# Patient Record
Sex: Male | Born: 1955 | Race: White | Hispanic: No | Marital: Married | Smoking: Never smoker
Health system: Southern US, Community
[De-identification: ages and names within clinical notes are randomized; demographics above are authoritative.]

## PROBLEM LIST (undated history)

## (undated) DIAGNOSIS — G629 Polyneuropathy, unspecified: Secondary | ICD-10-CM

## (undated) DIAGNOSIS — K5790 Diverticulosis of intestine, part unspecified, without perforation or abscess without bleeding: Secondary | ICD-10-CM

## (undated) DIAGNOSIS — E119 Type 2 diabetes mellitus without complications: Secondary | ICD-10-CM

## (undated) DIAGNOSIS — R350 Frequency of micturition: Secondary | ICD-10-CM

## (undated) DIAGNOSIS — R3915 Urgency of urination: Secondary | ICD-10-CM

## (undated) DIAGNOSIS — Z87442 Personal history of urinary calculi: Secondary | ICD-10-CM

## (undated) DIAGNOSIS — G709 Myoneural disorder, unspecified: Secondary | ICD-10-CM

## (undated) DIAGNOSIS — Z860100 Personal history of colon polyps, unspecified: Secondary | ICD-10-CM

## (undated) DIAGNOSIS — N400 Enlarged prostate without lower urinary tract symptoms: Secondary | ICD-10-CM

## (undated) DIAGNOSIS — R339 Retention of urine, unspecified: Secondary | ICD-10-CM

## (undated) DIAGNOSIS — I10 Essential (primary) hypertension: Secondary | ICD-10-CM

## (undated) HISTORY — PX: NO PAST SURGERIES: SHX2092

## (undated) HISTORY — PX: COLONOSCOPY: SHX174

---

## 2004-03-11 ENCOUNTER — Encounter: Admission: RE | Admit: 2004-03-11 | Discharge: 2004-03-11 | Payer: Self-pay | Admitting: Gastroenterology

## 2004-03-12 ENCOUNTER — Emergency Department (HOSPITAL_COMMUNITY): Admission: EM | Admit: 2004-03-12 | Discharge: 2004-03-13 | Payer: Self-pay | Admitting: Emergency Medicine

## 2007-09-03 ENCOUNTER — Encounter: Admission: RE | Admit: 2007-09-03 | Discharge: 2007-09-03 | Payer: Self-pay | Admitting: Rheumatology

## 2009-10-01 ENCOUNTER — Encounter: Admission: RE | Admit: 2009-10-01 | Discharge: 2009-10-01 | Payer: Self-pay | Admitting: Podiatry

## 2010-01-25 ENCOUNTER — Encounter: Payer: Self-pay | Admitting: Rheumatology

## 2011-04-01 ENCOUNTER — Emergency Department (HOSPITAL_COMMUNITY): Payer: Federal, State, Local not specified - PPO

## 2011-04-01 ENCOUNTER — Encounter (HOSPITAL_COMMUNITY): Payer: Self-pay | Admitting: *Deleted

## 2011-04-01 ENCOUNTER — Emergency Department (HOSPITAL_COMMUNITY)
Admission: EM | Admit: 2011-04-01 | Discharge: 2011-04-01 | Disposition: A | Discharge status: Home / Self Care | Payer: Federal, State, Local not specified - PPO | Attending: Emergency Medicine | Admitting: Emergency Medicine

## 2011-04-01 DIAGNOSIS — W1809XA Striking against other object with subsequent fall, initial encounter: Secondary | ICD-10-CM | POA: Insufficient documentation

## 2011-04-01 DIAGNOSIS — S81809A Unspecified open wound, unspecified lower leg, initial encounter: Secondary | ICD-10-CM

## 2011-04-01 DIAGNOSIS — S81009A Unspecified open wound, unspecified knee, initial encounter: Secondary | ICD-10-CM | POA: Insufficient documentation

## 2011-04-01 HISTORY — DX: Essential (primary) hypertension: I10

## 2011-04-01 HISTORY — DX: Polyneuropathy, unspecified: G62.9

## 2011-04-01 LAB — POCT I-STAT, CHEM 8
BUN: 16 mg/dL (ref 6–23)
Calcium, Ion: 1.24 mmol/L (ref 1.12–1.32)
Chloride: 108 mEq/L (ref 96–112)
Glucose, Bld: 108 mg/dL — ABNORMAL HIGH (ref 70–99)
HCT: 44 % (ref 39.0–52.0)
Potassium: 3.7 mEq/L (ref 3.5–5.1)
Sodium: 144 mEq/L (ref 135–145)

## 2011-04-01 LAB — DIFFERENTIAL
Basophils Relative: 0 % (ref 0–1)
Eosinophils Absolute: 0.1 10*3/uL (ref 0.0–0.7)
Lymphocytes Relative: 27 % (ref 12–46)
Monocytes Absolute: 0.6 10*3/uL (ref 0.1–1.0)
Neutro Abs: 4.5 10*3/uL (ref 1.7–7.7)
Neutrophils Relative %: 62 % (ref 43–77)

## 2011-04-01 LAB — CBC
MCHC: 34.5 g/dL (ref 30.0–36.0)
RBC: 5.13 MIL/uL (ref 4.22–5.81)
RDW: 13.2 % (ref 11.5–15.5)

## 2011-04-01 MED ORDER — DOXYCYCLINE HYCLATE 100 MG PO CAPS: 100.0000 mg | ORAL_CAPSULE | Freq: Two times a day (BID) | ORAL | Status: AC

## 2011-04-01 NOTE — ED Provider Notes (Signed)
Complains of wound and nonpainful shin which occurred as a result of falling against breakfast in January2013. Wound is nonpainful initially with scabbed lesion. Scab fell off last night and patient has had yellowish drainage from the wound since last night. On exam patient is alert nontoxic left lower extremity shin reveals a dime size lesion which is oozing yellowish clear material nontender no surrounding redness no fluctuance DP pulse 2+. I suspect given the location of the wound that he may have osteomyelitis  Doug Sou, MD 04/01/11 1858

## 2011-04-01 NOTE — ED Notes (Signed)
Left leg infection.  Pt injured this leg in January.  Wound is red and draining.  No fever or chills

## 2011-04-01 NOTE — ED Provider Notes (Signed)
History     CSN: [id number]  Arrival date & time 04/01/11  1612   First MD Initiated Contact with Patient 04/01/11 1720      Chief Complaint  Patient presents with  . Wound Infection    (Consider location/radiation/quality/duration/timing/severity/associated sxs/prior treatment) Patient is a 56 y.o. male presenting with leg pain. The history is provided by the patient and the spouse.  Leg Pain  Incident onset: 2 months ago. The incident occurred at home. The injury mechanism was a fall. The pain is present in the left leg. The pain is mild. The pain has been constant since onset. Pertinent negatives include no numbness, no inability to bear weight, no loss of motion, no loss of sensation and no tingling. He reports no foreign bodies present. The symptoms are aggravated by nothing. He has tried nothing for the symptoms. The treatment provided no relief.    Past Medical History  Diagnosis Date  . Neuropathy   . Hypertension   . Diabetes mellitus     History reviewed. No pertinent past surgical history.  No family history on file.  History  Substance Use Topics  . Smoking status: Not on file  . Smokeless tobacco: Not on file  . Alcohol Use: No      Review of Systems  Constitutional: Negative for fever and chills.  HENT: Negative for congestion and rhinorrhea.   Respiratory: Negative for cough and shortness of breath.   Cardiovascular: Negative for chest pain and leg swelling.  Gastrointestinal: Negative for nausea, vomiting, abdominal pain, constipation and blood in stool.  Genitourinary: Negative for dysuria and decreased urine volume.  Skin: Positive for wound.  Neurological: Negative for tingling, numbness and headaches.  Psychiatric/Behavioral: Negative for confusion.  All other systems reviewed and are negative.    Allergies  Review of patient's allergies indicates no known allergies.  Home Medications   Current Outpatient Rx  Name Route Sig Dispense  Refill  . METOPROLOL SUCCINATE ER 50 MG PO TB24 Oral Take 50 mg by mouth daily. Take with or immediately following a meal.      BP 157/88  Pulse 85  Temp(Src) 98.3 F (36.8 C) (Oral)  Resp 14  SpO2 96%  Physical Exam  Nursing note and vitals reviewed. Constitutional: He is oriented to person, place, and time. He appears well-developed and well-nourished.  HENT:  Head: Normocephalic and atraumatic.  Right Ear: External ear normal.  Left Ear: External ear normal.  Nose: Nose normal.  Neck: Neck supple.  Cardiovascular: Normal rate, regular rhythm, normal heart sounds and intact distal pulses.   Pulmonary/Chest: Effort normal and breath sounds normal. No respiratory distress. He has no wheezes. He has no rales.  Abdominal: Soft. He exhibits no distension and no mass. There is no tenderness. There is no rebound and no guarding.  Musculoskeletal: He exhibits no edema.       Legs: Lymphadenopathy:    He has no cervical adenopathy.  Neurological: He is alert and oriented to person, place, and time.  Skin: Skin is warm and dry.    ED Course  Procedures (including critical care time)  Labs Reviewed  CBC - Abnormal; Notable for the following:    Platelets 127 (*)    All other components within normal limits  POCT I-STAT, CHEM 8 - Abnormal; Notable for the following:    Glucose, Bld 108 (*)    All other components within normal limits  DIFFERENTIAL   Dg Tibia/fibula Left  04/01/2011  *RADIOLOGY REPORT*  Clinical Data: Healing wound anterior calf.  LEFT TIBIA AND FIBULA - 2 VIEW  Comparison: Left ankle 08/19/2009  Findings: No acute bony abnormality.  Specifically, no fracture, subluxation, or dislocation.  Soft tissues are intact.  Mild degenerative changes in the ankle.  IMPRESSION: No acute bony abnormality.  Original Report Authenticated By: Cyndie Chime, M.D.     1. Open wound, lower leg       MDM  56 yo male with left leg wound for a few months. Initially injured leg  in a fall on a brick wall, has now had poorly healing leg wound (about 1 dime size). Does not appear infected. Mild yellow-clear discharge since last night. Concern for osteo, but xray negative. Discussed with Dr Lequita Halt of Orthopedics on call, will put on doxy to help with MRSA coverage and get close follow up with orthopedics. Patient's VSS, no evidence of systemic infection. Will d/c home with precautions and wound care instructions.        Pricilla Loveless, MD 04/01/11 [phone]

## 2011-04-02 NOTE — ED Provider Notes (Signed)
I have personally seen and examined the patient.  I have discussed the plan of care with the resident.  I have reviewed the documentation on PMH/FH/Soc. History.  I have reviewed the documentation of the resident and agree.  Melville Engen, MD 04/02/11 0129 

## 2014-05-20 ENCOUNTER — Other Ambulatory Visit: Payer: Self-pay | Admitting: Urology

## 2014-05-27 ENCOUNTER — Other Ambulatory Visit: Payer: Self-pay | Admitting: Urology

## 2014-06-05 ENCOUNTER — Encounter (HOSPITAL_BASED_OUTPATIENT_CLINIC_OR_DEPARTMENT_OTHER): Payer: Self-pay | Admitting: *Deleted

## 2014-06-06 ENCOUNTER — Encounter (HOSPITAL_BASED_OUTPATIENT_CLINIC_OR_DEPARTMENT_OTHER): Payer: Self-pay | Admitting: *Deleted

## 2014-06-06 NOTE — Progress Notes (Addendum)
NPO AFTER MN.  ARRIVE AT 0715.  NEEDS ISTAT AND EKG AND UA (SPOKE W/ PAM, OR SCHEDULER, STATES PER DR ESKRIDGE OK TO DO UA DOS (ALREADY HAD UA W/ CULTURE DONE IN OFFICE).  WILL TAKE FLOMAX  AND MACRODANTIN AM DOS W/ SIPS OF WATER.

## 2014-06-12 NOTE — H&P (Signed)
History of Present Illness F/u -     1-BPH with lower urinary tract symptoms-several yr h/o weak stream, urinary urgency, intermittent flow. He was recently seen in the urgent care and was told he had an enlarged prostate on exam. He was treated with Cipro for prostatitis. No other tx or prostate surgery.  -June 2015 normal DRE, BPH on exam  -May 2016 PSA 2.39    2- nephrolithiasis-h/o a 7 mm left ureteral stone in 2006. He is not certain he ever passed it. H/o some dull low back pain which may occasionally be more on the left side.      May 2016 interval history  Patient returned to continue management of BPH with lower urinary tract symptoms and nephrolithiasis. A KUB today revealed no stones. Renal ultrasound showed multiple cysts in both kidneys with some more complex cyst in the left kidney. It was initially 390 mL, repeat postvoid 294 mL.    He continues to void with hesitancy and weak stream. He really notices a decrease in his stream if he misses a dose of tamsulosin.    ua clear.     PSA is normal at 2.39.     Past Medical History Problems  1. History of diabetes mellitus (Z86.39) 2. History of hypertension (Z86.79) 3. History of renal calculi (Z61.096)  Surgical History Problems  1. History of No Surgical Problems  Current Meds 1. Tamsulosin HCl - 0.4 MG Oral Capsule; TAKE 1 CAPSULE Daily;  Therapy: 29Jul2015 to (Evaluate:23Jul2016)  Requested for: 29Jul2015; Last  Rx:29Jul2015 Ordered 2. Valsartan-Hydrochlorothiazide TABS;  Therapy: (Recorded:29Jul2015) to Recorded  Allergies Medication  1. Tamsulosin HCl CAPS  Family History Problems  1. Family history of hypertension (Z82.49) : Father 2. Family history of malignant neoplasm (Z80.9) : Father  Social History Problems  1. Married 2. Never a smoker 3. No alcohol use 4. No caffeine use 5. One child  Vitals Vital Signs [Data Includes: Last 1 Day]  Recorded: 12May2016 12:03PM  Blood  Pressure: 117 / 69 Temperature: 98.2 F Heart Rate: 85  Physical Exam Constitutional: Well nourished and well developed . No acute distress.  Pulmonary: No respiratory distress and normal respiratory rhythm and effort.  Cardiovascular: Heart rate and rhythm are normal . No peripheral edema.  Neuro/Psych:. Mood and affect are appropriate.    Results/Data Urine [Data Includes: Last 1 Day]   12May2016  COLOR YELLOW   APPEARANCE CLEAR   SPECIFIC GRAVITY 1.020   pH 5.5   GLUCOSE 250 mg/dL  BILIRUBIN NEG   KETONE NEG mg/dL  BLOOD NEG   PROTEIN NEG mg/dL  UROBILINOGEN 0.2 mg/dL  NITRITE NEG   LEUKOCYTE ESTERASE NEG    Procedure KUB today-comparison to prior CT, findings: The kidneys appeared normal. There were no stones or calcifications over the either kidney or the ureters. Bladder area appeared normal. There was a small stable right phlebolith. The bowels appeared normal. Liver and spleen shadows appear normal. The bowel gas pattern appear normal.   Assessment Assessed  1. Complex renal cyst (N28.1) 2. BPH (benign prostatic hypertrophy) with urinary obstruction (N40.1,N13.8) 3. Incomplete bladder emptying (R33.9)  Plan  Complex renal cyst  1. BUN & CREATININE; Status:In Progress - Specimen/Data Collected;   Done: 12May2016 2. VENIPUNCTURE; Status:Complete;   Done: 12May2016 Health Maintenance  3. UA With REFLEX; [Do Not Release]; Status:Complete;   Done: 12May2016 10:55AM Incomplete bladder emptying  4. Follow-up Schedule Surgery Office  Follow-up  Status: Hold For - Appointment   Requested for: 12May2016  Urine sent for culture as a precaution, CT of the abdomen and pelvis to assess prostate size as well as screening complex renal cyst, follow-up for greenlight photo vaporization of the prostate   Discussion/Summary     BPH associated with weak stream and incomplete bladder emptying-we discussed the nature risk and benefits of adding a 5 alpha reductase ACE inhibitor to  the tamsulosin. Also discussed prostate procedures. Patient said his dad had to undergo a TURP and his dad said he voided much better when he finally had the surgery done compared to when he was taking medication. The patient would like to limit medications which is certainly reasonable. We discussed the nature risks benefits and alternatives to greenlight photo vaporization of the prostate. Patient reviewed the surgical video. We discussed risks such as bleeding, infection, stricture, incontinence, erectile dysfunction and retrograde ejaculation among others. We discussed in most cases flow symptoms and incomplete bladder emptying improve but there are some cases where symptoms of weak flow and incomplete bladder emptying continue which my be more related to the bladder.     inc bladder emptying - we discussed causes of incomplete bladder emptying stitches bladder dysfunction versus bladder outlet obstruction. He is voiding now and therefore I don't think any of the catheter CIC. As we talked about adding other medications or procedures.     nephrolithiasis, back pain, complex renal cysts on U/S - will follow-up with CT.      Signatures Electronically signed by : Jerilee Field, M.D.; May 15 2014 12:41PM EST   Add: urine culture grew staph and I started him on nitrofurantoin daily. May 2016 CT abdomen and pelvis-stable bilateral renal cysts looking back to 2006; prostate about 56 g but a prominent median lobe and distended bladder.

## 2014-06-13 ENCOUNTER — Encounter (HOSPITAL_BASED_OUTPATIENT_CLINIC_OR_DEPARTMENT_OTHER)
Admission: RE | Disposition: A | Discharge status: Home / Self Care | Payer: Self-pay | Source: Ambulatory Visit | Attending: Urology

## 2014-06-13 ENCOUNTER — Ambulatory Visit (HOSPITAL_BASED_OUTPATIENT_CLINIC_OR_DEPARTMENT_OTHER): Payer: Federal, State, Local not specified - PPO | Admitting: Anesthesiology

## 2014-06-13 ENCOUNTER — Encounter (HOSPITAL_BASED_OUTPATIENT_CLINIC_OR_DEPARTMENT_OTHER): Payer: Self-pay | Admitting: *Deleted

## 2014-06-13 ENCOUNTER — Ambulatory Visit (HOSPITAL_BASED_OUTPATIENT_CLINIC_OR_DEPARTMENT_OTHER)
Admission: RE | Admit: 2014-06-13 | Discharge: 2014-06-13 | Disposition: A | Discharge status: Home / Self Care | Payer: Federal, State, Local not specified - PPO | Source: Ambulatory Visit | Attending: Urology | Admitting: Urology

## 2014-06-13 DIAGNOSIS — R3912 Poor urinary stream: Secondary | ICD-10-CM | POA: Insufficient documentation

## 2014-06-13 DIAGNOSIS — N359 Urethral stricture, unspecified: Secondary | ICD-10-CM | POA: Diagnosis not present

## 2014-06-13 DIAGNOSIS — Z79899 Other long term (current) drug therapy: Secondary | ICD-10-CM | POA: Insufficient documentation

## 2014-06-13 DIAGNOSIS — E119 Type 2 diabetes mellitus without complications: Secondary | ICD-10-CM | POA: Diagnosis not present

## 2014-06-13 DIAGNOSIS — I10 Essential (primary) hypertension: Secondary | ICD-10-CM | POA: Insufficient documentation

## 2014-06-13 DIAGNOSIS — N390 Urinary tract infection, site not specified: Secondary | ICD-10-CM | POA: Diagnosis not present

## 2014-06-13 DIAGNOSIS — Z87442 Personal history of urinary calculi: Secondary | ICD-10-CM | POA: Insufficient documentation

## 2014-06-13 DIAGNOSIS — N138 Other obstructive and reflux uropathy: Secondary | ICD-10-CM | POA: Insufficient documentation

## 2014-06-13 DIAGNOSIS — N281 Cyst of kidney, acquired: Secondary | ICD-10-CM | POA: Insufficient documentation

## 2014-06-13 DIAGNOSIS — R3914 Feeling of incomplete bladder emptying: Secondary | ICD-10-CM | POA: Insufficient documentation

## 2014-06-13 DIAGNOSIS — N401 Enlarged prostate with lower urinary tract symptoms: Secondary | ICD-10-CM | POA: Insufficient documentation

## 2014-06-13 HISTORY — PX: GREEN LIGHT LASER TURP (TRANSURETHRAL RESECTION OF PROSTATE: SHX6260

## 2014-06-13 HISTORY — DX: Benign prostatic hyperplasia without lower urinary tract symptoms: N40.0

## 2014-06-13 HISTORY — DX: Urgency of urination: R39.15

## 2014-06-13 HISTORY — DX: Frequency of micturition: R35.0

## 2014-06-13 HISTORY — DX: Retention of urine, unspecified: R33.9

## 2014-06-13 HISTORY — DX: Type 2 diabetes mellitus without complications: E11.9

## 2014-06-13 HISTORY — DX: Personal history of urinary calculi: Z87.442

## 2014-06-13 LAB — URINALYSIS, ROUTINE W REFLEX MICROSCOPIC
BILIRUBIN URINE: NEGATIVE
Glucose, UA: 250 mg/dL — AB
Hgb urine dipstick: NEGATIVE
KETONES UR: NEGATIVE mg/dL
Leukocytes, UA: NEGATIVE
Nitrite: NEGATIVE
PROTEIN: NEGATIVE mg/dL
Specific Gravity, Urine: 1.024 (ref 1.005–1.030)
UROBILINOGEN UA: 0.2 mg/dL (ref 0.0–1.0)
pH: 5.5 (ref 5.0–8.0)

## 2014-06-13 LAB — POCT I-STAT 4, (NA,K, GLUC, HGB,HCT)
Glucose, Bld: 175 mg/dL — ABNORMAL HIGH (ref 65–99)
HEMATOCRIT: 41 % (ref 39.0–52.0)
HEMOGLOBIN: 13.9 g/dL (ref 13.0–17.0)
Potassium: 4 mmol/L (ref 3.5–5.1)
Sodium: 140 mmol/L (ref 135–145)

## 2014-06-13 LAB — GLUCOSE, CAPILLARY: Glucose-Capillary: 147 mg/dL — ABNORMAL HIGH (ref 65–99)

## 2014-06-13 SURGERY — GREEN LIGHT LASER TURP (TRANSURETHRAL RESECTION OF PROSTATE
Anesthesia: General | Site: Prostate

## 2014-06-13 MED ORDER — FENTANYL CITRATE (PF) 100 MCG/2ML IJ SOLN
INTRAMUSCULAR | Status: AC
  Filled 2014-06-13: qty 4

## 2014-06-13 MED ORDER — CEFAZOLIN SODIUM-DEXTROSE 2-3 GM-% IV SOLR
INTRAVENOUS | Status: AC
  Filled 2014-06-13: qty 50

## 2014-06-13 MED ORDER — CEFAZOLIN SODIUM-DEXTROSE 2-3 GM-% IV SOLR
2.0000 g | INTRAVENOUS | Status: AC
  Administered 2014-06-13: 2 g via INTRAVENOUS
  Filled 2014-06-13: qty 50

## 2014-06-13 MED ORDER — LACTATED RINGERS IV SOLN
INTRAVENOUS | Status: DC
  Administered 2014-06-13: 08:00:00 via INTRAVENOUS
  Filled 2014-06-13: qty 1000

## 2014-06-13 MED ORDER — FENTANYL CITRATE (PF) 100 MCG/2ML IJ SOLN
INTRAMUSCULAR | Status: DC | PRN
  Administered 2014-06-13: 50 ug via INTRAVENOUS
  Administered 2014-06-13: 25 ug via INTRAVENOUS

## 2014-06-13 MED ORDER — PHENYLEPHRINE HCL 10 MG/ML IJ SOLN
INTRAMUSCULAR | Status: DC | PRN
  Administered 2014-06-13 (×3): 80 ug via INTRAVENOUS

## 2014-06-13 MED ORDER — MIDAZOLAM HCL 5 MG/5ML IJ SOLN
INTRAMUSCULAR | Status: DC | PRN
  Administered 2014-06-13: 2 mg via INTRAVENOUS

## 2014-06-13 MED ORDER — MIDAZOLAM HCL 2 MG/2ML IJ SOLN
INTRAMUSCULAR | Status: AC
  Filled 2014-06-13: qty 2

## 2014-06-13 MED ORDER — DEXAMETHASONE SODIUM PHOSPHATE 4 MG/ML IJ SOLN
INTRAMUSCULAR | Status: DC | PRN
  Administered 2014-06-13: 10 mg via INTRAVENOUS

## 2014-06-13 MED ORDER — LIDOCAINE HCL (CARDIAC) 20 MG/ML IV SOLN
INTRAVENOUS | Status: DC | PRN
  Administered 2014-06-13: 100 mg via INTRAVENOUS

## 2014-06-13 MED ORDER — OXYCODONE-ACETAMINOPHEN 5-325 MG PO TABS: 1.0000 | ORAL_TABLET | Freq: Four times a day (QID) | ORAL | Status: DC | PRN

## 2014-06-13 MED ORDER — EPHEDRINE SULFATE 50 MG/ML IJ SOLN
INTRAMUSCULAR | Status: DC | PRN
  Administered 2014-06-13: 10 mg via INTRAVENOUS

## 2014-06-13 MED ORDER — ONDANSETRON HCL 4 MG/2ML IJ SOLN
INTRAMUSCULAR | Status: DC | PRN
  Administered 2014-06-13: 4 mg via INTRAVENOUS

## 2014-06-13 MED ORDER — CEFAZOLIN SODIUM 1-5 GM-% IV SOLN
1.0000 g | INTRAVENOUS | Status: DC
  Filled 2014-06-13: qty 50

## 2014-06-13 MED ORDER — ONDANSETRON HCL 4 MG/2ML IJ SOLN
4.0000 mg | Freq: Four times a day (QID) | INTRAMUSCULAR | Status: DC | PRN
  Filled 2014-06-13: qty 2

## 2014-06-13 MED ORDER — OXYCODONE HCL 5 MG/5ML PO SOLN
5.0000 mg | Freq: Once | ORAL | Status: AC | PRN
  Filled 2014-06-13: qty 5

## 2014-06-13 MED ORDER — PHENYLEPHRINE HCL 10 MG/ML IJ SOLN
10.0000 mg | INTRAVENOUS | Status: DC | PRN
  Administered 2014-06-13: 40 ug/min via INTRAVENOUS

## 2014-06-13 MED ORDER — SODIUM CHLORIDE 0.9 % IR SOLN
Status: DC | PRN
  Administered 2014-06-13: 16000 mL

## 2014-06-13 MED ORDER — HYDROMORPHONE HCL 1 MG/ML IJ SOLN
0.2500 mg | INTRAMUSCULAR | Status: DC | PRN
  Filled 2014-06-13: qty 1

## 2014-06-13 MED ORDER — OXYCODONE HCL 5 MG PO TABS
5.0000 mg | ORAL_TABLET | Freq: Once | ORAL | Status: AC | PRN
  Administered 2014-06-13: 5 mg via ORAL
  Filled 2014-06-13: qty 1

## 2014-06-13 MED ORDER — OXYCODONE HCL 5 MG PO TABS
ORAL_TABLET | ORAL | Status: AC
  Filled 2014-06-13: qty 1

## 2014-06-13 MED ORDER — ACETAMINOPHEN 10 MG/ML IV SOLN
INTRAVENOUS | Status: DC | PRN
  Administered 2014-06-13: 1000 mg via INTRAVENOUS

## 2014-06-13 MED ORDER — PROPOFOL 10 MG/ML IV BOLUS
INTRAVENOUS | Status: DC | PRN
  Administered 2014-06-13: 200 mg via INTRAVENOUS

## 2014-06-13 SURGICAL SUPPLY — 29 items
BAG URINE DRAINAGE (UROLOGICAL SUPPLIES) ×2 IMPLANT
BAG URO CATCHER STRL LF (DRAPE) ×2 IMPLANT
CATH COUDE FOLEY 2W 5CC 18FR (CATHETERS) IMPLANT
CATH FOLEY 2WAY SLVR  5CC 18FR (CATHETERS)
CATH FOLEY 2WAY SLVR 5CC 18FR (CATHETERS) IMPLANT
CLOTH BEACON ORANGE TIMEOUT ST (SAFETY) ×2 IMPLANT
ELECT BIVAP BIPO 22/24 DONUT (ELECTROSURGICAL)
ELECT LOOP MED HF 24F 12D (CUTTING LOOP) IMPLANT
ELECTRD BIVAP BIPO 22/24 DONUT (ELECTROSURGICAL) IMPLANT
FEE RENTAL LASER GREENLIGHT (Laser) ×1 IMPLANT
GLOVE BIO SURGEON STRL SZ 6.5 (GLOVE) ×1 IMPLANT
GLOVE BIO SURGEON STRL SZ7.5 (GLOVE) ×2 IMPLANT
GLOVE BIOGEL PI IND STRL 6.5 (GLOVE) IMPLANT
GLOVE BIOGEL PI INDICATOR 6.5 (GLOVE) ×3
GOWN STRL REUS W/ TWL XL LVL3 (GOWN DISPOSABLE) ×1 IMPLANT
GOWN STRL REUS W/TWL LRG LVL3 (GOWN DISPOSABLE) ×1 IMPLANT
GOWN STRL REUS W/TWL XL LVL3 (GOWN DISPOSABLE) ×2
HOLDER FOLEY CATH W/STRAP (MISCELLANEOUS) ×1 IMPLANT
IV NS 1000ML (IV SOLUTION) ×2
IV NS 1000ML BAXH (IV SOLUTION) ×1 IMPLANT
IV NS IRRIG 3000ML ARTHROMATIC (IV SOLUTION) ×6 IMPLANT
LASER FIBER /GREENLIGHT LASER (Laser) ×2 IMPLANT
LASER GREENLIGHT RENTAL P/PROC (Laser) ×2 IMPLANT
LOOP CUT BIPOLAR 24F LRG (ELECTROSURGICAL) IMPLANT
PACK CYSTO (CUSTOM PROCEDURE TRAY) ×2 IMPLANT
SYR 30ML LL (SYRINGE) IMPLANT
SYRINGE IRR TOOMEY STRL 70CC (SYRINGE) IMPLANT
TUBE CONNECTING 12X1/4 (SUCTIONS) ×1 IMPLANT
TUBING SUCTION BULK 100 FT (MISCELLANEOUS) IMPLANT

## 2014-06-13 NOTE — Discharge Instructions (Signed)
Prostate Laser Surgery, Care After Refer to this sheet in the next few weeks. These instructions provide you with information on caring for yourself after your procedure. Your health care provider may also give you more specific instructions. Your treatment has been planned according to current medical practices, but problems sometimes occur. Call your health care provider if you have any problems or questions after your procedure. WHAT TO EXPECT AFTER THE PROCEDURE  You may notice blood in your urine that could last up to 3 weeks.  After your catheter is removed, you will have burning (especially at the tip of your penis) when you urinate, especially at the end of urination. For the first few weeks after your procedure, you will feel the need to urinate often. HOME CARE INSTRUCTIONS  1. Do not perform vigorous exercise, especially heavy lifting, for 1 week or as directed by your health care provider. 2. Avoid sexual activity for 4-6 weeks or as directed by your health care provider. 3. Do not ride in a car for extended periods for 1 month or as directed by your health care provider. 4. Do not strain to have a bowel movement. Drink a lot of fluids and and make sure you get enough fiber in your diet. 5. Drink enough fluids to keep your urine clear or pale yellow. SEEK IMMEDIATE MEDICAL CARE IF:  1. Your catheter has been removed and you are suddenly unable to urinate. 2. Your catheter has not been removed, and it develops a blockage. 3. You start to have blood clots in your urine. 4. The blood in your urine becomes persistent or gets thick. 5. Your temperature is greater than 100.71F (38.1C). 6. You develop chest pains. 7. You develop shortness of breath. 8. You develop leg swelling or pain. MAKE SURE YOU: 1. Understand these instructions. 2. Will watch your condition. 3. Will get help right away if you are not doing well or get worse.     Foley Catheter Care A Foley catheter is a  soft, flexible tube that is placed into the bladder to drain urine. A Foley catheter may be inserted if:  You leak urine or are not able to control when you urinate (urinary incontinence).  You are not able to urinate when you need to (urinary retention).  You had prostate surgery or surgery on the genitals.  You have certain medical conditions, such as multiple sclerosis, dementia, or a spinal cord injury. If you are going home with a Foley catheter in place, follow the instructions below. TAKING CARE OF THE CATHETER 6. Wash your hands with soap and water. 7. Using mild soap and warm water on a clean washcloth:  Clean the area on your body closest to the catheter insertion site using a circular motion, moving away from the catheter. Never wipe toward the catheter because this could sweep bacteria up into the urethra and cause infection.  Remove all traces of soap. Pat the area dry with a clean towel. For males, reposition the foreskin. 8. Attach the catheter to your leg so there is no tension on the catheter. Use adhesive tape or a leg strap. If you are using adhesive tape, remove any sticky residue left behind by the previous tape you used. 9. Keep the drainage bag below the level of the bladder, but keep it off the floor. 10. Check throughout the day to be sure the catheter is working and urine is draining freely. Make sure the tubing does not become kinked. 11. Do not  pull on the catheter or try to remove it. Pulling could damage internal tissues. TAKING CARE OF THE DRAINAGE BAGS You will be given two drainage bags to take home. One is a large overnight drainage bag, and the other is a smaller leg bag that fits underneath clothing. You may wear the overnight bag at any time, but you should never wear the smaller leg bag at night. Follow the instructions below for how to empty, change, and clean your drainage bags. Emptying the Drainage Bag You must empty your drainage bag when it is  -  full or at least 2-3 times a day. 9. Wash your hands with soap and water. 10. Keep the drainage bag below your hips, below the level of your bladder. This stops urine from going back into the tubing and into your bladder. 11. Hold the dirty bag over the toilet or a clean container. 12. Open the pour spout at the bottom of the bag and empty the urine into the toilet or container. Do not let the pour spout touch the toilet, container, or any other surface. Doing so can place bacteria on the bag, which can cause an infection. 13. Clean the pour spout with a gauze pad or cotton ball that has rubbing alcohol on it. 14. Close the pour spout. 15. Attach the bag to your leg with adhesive tape or a leg strap. 16. Wash your hands well. Changing the Drainage Bag Change your drainage bag once a month or sooner if it starts to smell bad or look dirty. Below are steps to follow when changing the drainage bag. 4. Wash your hands with soap and water. 5. Pinch off the rubber catheter so that urine does not spill out. 6. Disconnect the catheter tube from the drainage tube at the connection valve. Do not let the tubes touch any surface. 7. Clean the end of the catheter tube with an alcohol wipe. Use a different alcohol wipe to clean the end of the drainage tube. 8. Connect the catheter tube to the drainage tube of the clean drainage bag. 9. Attach the new bag to the leg with adhesive tape or a leg strap. Avoid attaching the new bag too tightly. 10. Wash your hands well. Cleaning the Drainage Bag 1. Wash your hands with soap and water. 2. Wash the bag in warm, soapy water. 3. Rinse the bag thoroughly with warm water. 4. Fill the bag with a solution of white vinegar and water (1 cup vinegar to 1 qt warm water [.2 L vinegar to 1 L warm water]). Close the bag and soak it for 30 minutes in the solution. 5. Rinse the bag with warm water. 6. Hang the bag to dry with the pour spout open and hanging downward. 7. Store  the clean bag (once it is dry) in a clean plastic bag. 8. Wash your hands well. PREVENTING INFECTION  Wash your hands before and after handling your catheter.  Take showers daily and wash the area where the catheter enters your body. Do not take baths. Replace wet leg straps with dry ones, if this applies.  Do not use powders, sprays, or lotions on the genital area. Only use creams, lotions, or ointments as directed by your caregiver.  For females, wipe from front to back after each bowel movement.  Drink enough fluids to keep your urine clear or pale yellow unless you have a fluid restriction.  Do not let the drainage bag or tubing touch or lie on the floor.  Wear cotton underwear to absorb moisture and to keep your skin drier. SEEK MEDICAL CARE IF:   Your urine is cloudy or smells unusually bad.  Your catheter becomes clogged.  You are not draining urine into the bag or your bladder feels full.  Your catheter starts to leak. SEEK IMMEDIATE MEDICAL CARE IF:   You have pain, swelling, redness, or pus where the catheter enters the body.  You have pain in the abdomen, legs, lower back, or bladder.  You have a fever.  You see blood fill the catheter, or your urine is pink or red.  You have nausea, vomiting, or chills.  Your catheter gets pulled out. MAKE SURE YOU:   Understand these instructions.  Will watch your condition.  Will get help right away if you are not doing well or get worse. Document Released: 12/20/2004 Document Revised: 05/06/2013 Document Reviewed: 12/12/2011 [facility] Patient Information 2015 [place], [place]. This information is not intended to replace advice given to you by your health care provider. Make sure you discuss any questions you have with your health care provider.       Post Anesthesia Home Care Instructions  Activity: Get plenty of rest for the remainder of the day. A responsible adult should stay with you for 24 hours following the  procedure.  For the next 24 hours, DO NOT: -Drive a car -Advertising copywriter -Drink alcoholic beverages -Take any medication unless instructed by your physician -Make any legal decisions or sign important papers.  Meals: Start with liquid foods such as gelatin or soup. Progress to regular foods as tolerated. Avoid greasy, spicy, heavy foods. If nausea and/or vomiting occur, drink only clear liquids until the nausea and/or vomiting subsides. Call your physician if vomiting continues.  Special Instructions/Symptoms: Your throat may feel dry or sore from the anesthesia or the breathing tube placed in your throat during surgery. If this causes discomfort, gargle with warm salt water. The discomfort should disappear within 24 hours.  If you had a scopolamine patch placed behind your ear for the management of post- operative nausea and/or vomiting:  1. The medication in the patch is effective for 72 hours, after which it should be removed.  Wrap patch in a tissue and discard in the trash. Wash hands thoroughly with soap and water. 2. You may remove the patch earlier than 72 hours if you experience unpleasant side effects which may include dry mouth, dizziness or visual disturbances. 3. Avoid touching the patch. Wash your hands with soap and water after contact with the patch.

## 2014-06-13 NOTE — Interval H&P Note (Signed)
History and Physical Interval Note:  06/13/2014 8:31 AM  Patrick Buckley  has presented today for surgery, with the diagnosis of BENIGN PROSTATIC HYPERTROPHY, INCOMPLETE BLADDER EMPTYING  The various methods of treatment have been discussed with the patient and family. After consideration of risks, benefits and other options for treatment, the patient has consented to  Procedure(s): GREEN LIGHT LASER TURP (TRANSURETHRAL RESECTION OF PROSTATE (N/A) as a surgical intervention .  The patient's history has been reviewed, patient examined, no change in status, stable for surgery. Pt is well today. No dysuria, flank pain or fever. UA clear.  I have reviewed the patient's chart and labs. Discussed with patient and wife, drew picture, the nature, potential benefits, risks and alternatives to GL PVP, including side effects of the proposed treatment, the likelihood of the patient achieving the goals of the procedure, and any potential problems that might occur during the procedure or recuperation. All questions answered. Patient elects to proceed.      [name]

## 2014-06-13 NOTE — Transfer of Care (Signed)
Immediate Anesthesia Transfer of Care Note  Patient: Patrick Buckley  Procedure(s) Performed: Procedure(s): GREEN LIGHT LASER TURP (TRANSURETHRAL RESECTION OF PROSTATE (N/A) URETERAL DILITATION (N/A)  Patient Location: PACU  Anesthesia Type:General  Level of Consciousness: sedated and responds to stimulation  Airway & Oxygen Therapy: Patient Spontanous Breathing and Patient connected to nasal cannula oxygen  Post-op Assessment: Report given to RN  Post vital signs: Reviewed and stable  Last Vitals:  Filed Vitals:   06/13/14 0735  BP: 162/94  Pulse: 77  Temp: 36.9 C  Resp: 12    Complications: No apparent anesthesia complications

## 2014-06-13 NOTE — Anesthesia Procedure Notes (Signed)
Procedure Name: LMA Insertion Date/Time: 06/13/2014 8:43 AM Performed by: Maris Berger T Pre-anesthesia Checklist: Patient identified, Emergency Drugs available, Suction available and Patient being monitored Patient Re-evaluated:Patient Re-evaluated prior to inductionOxygen Delivery Method: Circle System Utilized Preoxygenation: Pre-oxygenation with 100% oxygen Intubation Type: IV induction Ventilation: Mask ventilation without difficulty LMA: LMA inserted LMA Size: 5.0 Number of attempts: 1 Airway Equipment and Method: Bite block Placement Confirmation: positive ETCO2 Dental Injury: Teeth and Oropharynx as per pre-operative assessment

## 2014-06-13 NOTE — Op Note (Signed)
Preoperative diagnosis: BPH, incomplete bladder emptying, recurrent UTI,weak stream Postoperative diagnosis: Same  Procedure: Greenlight photo vaporization of the prostate  Surgeon: Mena Goes  Anesthesia: Gen.  Indication for procedure: 59 year old male BPH, incomplete bladder emptying, weak stream on medication with continued issues.recurrent UTI. Elected to proceed with surgical treatment of the prostate greenlight photo vaporization of the prostate after considering other options.started the patient on preoperative nitrofurantoin his urine was clear today and he was stable. Vital signs were normal.  Findings: On exam under anesthesia the penis was normal without mass or lesion. The testicles were descended bilaterally and palpably normal. On digital rectal exam the prostate was mildly enlarged and smooth without hard area or nodule.  On cystoscopy there was some mild meatal stenosis which needed to be dilated mainly to accommodate the irregular tip of the laser scope sheath. The urethra appeared normal. The prostatic urethra revealed trilobar hypertrophy with the median lobe. The bladder mucosa appeared normal. There were no stones or foreign bodies in the bladder. The trigone and ureteral orifices were in their normal orthotopic position with clear efflux.    Description of procedure: After consent was obtained patient brought to the operating room. After adequate anesthesia he is placed in lithotomy position and prepped and draped in the usual sterile fashion. A timeout was performed to confirm the patient and procedure. An exam under anesthesia was performed. I then tried to pass the laser scope was difficult to get sheath to access the meatus I dilated to 24 [place] and the sheath passed without difficulty. The visual obturator was used. The bladder inspected.I irrigated the bladder about 4 times. The laser fiber was then passed in a started at 80 W making incisions at 5:00 and 7:00 to  delineate the median lobe. Brought these incisions down and the typical hockey-stick incisions at the apex lateral to the veru. The median lobe was then vaporized which created a good channel. I then went anterior to posterior bladder neck to veru very backed bladder neck vaporizing the lateral lobes. Under low pressure there was excellent hemostasis and a good channel. He was refilled and some of them remainder lateral lobe tissue vaporized. Again under low pressure there was excellent hemostasis and a good channel. The bladder was refilled and the scope removed. An 14 French coud catheter was placed with 13 mL in the balloon and seated at the bladder neck. The urine was clear. Irrigated normally. Patient was awakened and taken to recovery room in stable condition.  Complications: None Blood loss: Minimal Specimens: None  Drains: 18 French coud catheter

## 2014-06-13 NOTE — Anesthesia Preprocedure Evaluation (Signed)
Anesthesia Evaluation  Patient identified by MRN, date of birth, ID band Patient awake    Reviewed: Allergy & Precautions, NPO status , Patient's Chart, lab work & pertinent test results  Airway Mallampati: II   Neck ROM: full    Dental   Pulmonary neg pulmonary ROS,  breath sounds clear to auscultation        Cardiovascular hypertension, Rhythm:regular Rate:Normal     Neuro/Psych    GI/Hepatic   Endo/Other  diabetes, Type 2  Renal/GU      Musculoskeletal   Abdominal   Peds  Hematology   Anesthesia Other Findings   Reproductive/Obstetrics                             Anesthesia Physical Anesthesia Plan  ASA: II  Anesthesia Plan: General   Post-op Pain Management:    Induction: Intravenous  Airway Management Planned: LMA  Additional Equipment:   Intra-op Plan:   Post-operative Plan:   Informed Consent: I have reviewed the patients History and Physical, chart, labs and discussed the procedure including the risks, benefits and alternatives for the proposed anesthesia with the patient or authorized representative who has indicated his/her understanding and acceptance.     Plan Discussed with: CRNA, Anesthesiologist and Surgeon  Anesthesia Plan Comments:         Anesthesia Quick Evaluation

## 2014-06-16 ENCOUNTER — Encounter (HOSPITAL_BASED_OUTPATIENT_CLINIC_OR_DEPARTMENT_OTHER): Payer: Self-pay | Admitting: Urology

## 2014-06-16 NOTE — Anesthesia Postprocedure Evaluation (Signed)
Anesthesia Post Note  Patient: Patrick Buckley  Procedure(s) Performed: Procedure(s) (LRB): GREEN LIGHT LASER TURP (TRANSURETHRAL RESECTION OF PROSTATE WITH URETHRAL DILITATION (N/A)  Anesthesia type: General  Patient location: PACU  Post pain: Pain level controlled and Adequate analgesia  Post assessment: Post-op Vital signs reviewed, Patient's Cardiovascular Status Stable, Respiratory Function Stable, Patent Airway and Pain level controlled  Last Vitals:  Filed Vitals:   06/13/14 1157  BP: 132/70  Pulse: 79  Temp: 36.4 C  Resp: 12    Post vital signs: Reviewed and stable  Level of consciousness: awake, alert  and oriented  Complications: No apparent anesthesia complications

## 2015-04-09 DIAGNOSIS — E291 Testicular hypofunction: Secondary | ICD-10-CM | POA: Diagnosis not present

## 2015-04-16 DIAGNOSIS — E291 Testicular hypofunction: Secondary | ICD-10-CM | POA: Diagnosis not present

## 2015-04-23 DIAGNOSIS — E291 Testicular hypofunction: Secondary | ICD-10-CM | POA: Diagnosis not present

## 2015-05-07 DIAGNOSIS — E291 Testicular hypofunction: Secondary | ICD-10-CM | POA: Diagnosis not present

## 2015-05-21 DIAGNOSIS — E291 Testicular hypofunction: Secondary | ICD-10-CM | POA: Diagnosis not present

## 2015-06-04 DIAGNOSIS — E291 Testicular hypofunction: Secondary | ICD-10-CM | POA: Diagnosis not present

## 2015-06-22 DIAGNOSIS — E291 Testicular hypofunction: Secondary | ICD-10-CM | POA: Diagnosis not present

## 2015-07-13 DIAGNOSIS — E291 Testicular hypofunction: Secondary | ICD-10-CM | POA: Diagnosis not present

## 2015-07-21 DIAGNOSIS — Z79899 Other long term (current) drug therapy: Secondary | ICD-10-CM | POA: Diagnosis not present

## 2015-07-21 DIAGNOSIS — E119 Type 2 diabetes mellitus without complications: Secondary | ICD-10-CM | POA: Diagnosis not present

## 2015-07-21 DIAGNOSIS — I10 Essential (primary) hypertension: Secondary | ICD-10-CM | POA: Diagnosis not present

## 2015-07-21 DIAGNOSIS — R0789 Other chest pain: Secondary | ICD-10-CM | POA: Diagnosis not present

## 2015-07-21 DIAGNOSIS — E291 Testicular hypofunction: Secondary | ICD-10-CM | POA: Diagnosis not present

## 2015-07-29 DIAGNOSIS — E291 Testicular hypofunction: Secondary | ICD-10-CM | POA: Diagnosis not present

## 2015-08-12 DIAGNOSIS — E291 Testicular hypofunction: Secondary | ICD-10-CM | POA: Diagnosis not present

## 2015-08-26 DIAGNOSIS — E291 Testicular hypofunction: Secondary | ICD-10-CM | POA: Diagnosis not present

## 2015-09-09 DIAGNOSIS — E291 Testicular hypofunction: Secondary | ICD-10-CM | POA: Diagnosis not present

## 2015-09-23 DIAGNOSIS — E291 Testicular hypofunction: Secondary | ICD-10-CM | POA: Diagnosis not present

## 2015-10-07 DIAGNOSIS — E291 Testicular hypofunction: Secondary | ICD-10-CM | POA: Diagnosis not present

## 2015-10-21 DIAGNOSIS — E291 Testicular hypofunction: Secondary | ICD-10-CM | POA: Diagnosis not present

## 2015-11-04 DIAGNOSIS — E291 Testicular hypofunction: Secondary | ICD-10-CM | POA: Diagnosis not present

## 2015-11-16 DIAGNOSIS — I10 Essential (primary) hypertension: Secondary | ICD-10-CM | POA: Diagnosis not present

## 2015-11-16 DIAGNOSIS — E119 Type 2 diabetes mellitus without complications: Secondary | ICD-10-CM | POA: Diagnosis not present

## 2015-11-16 DIAGNOSIS — Z23 Encounter for immunization: Secondary | ICD-10-CM | POA: Diagnosis not present

## 2015-11-18 DIAGNOSIS — E291 Testicular hypofunction: Secondary | ICD-10-CM | POA: Diagnosis not present

## 2015-12-02 DIAGNOSIS — E291 Testicular hypofunction: Secondary | ICD-10-CM | POA: Diagnosis not present

## 2015-12-16 DIAGNOSIS — E291 Testicular hypofunction: Secondary | ICD-10-CM | POA: Diagnosis not present

## 2015-12-30 DIAGNOSIS — E291 Testicular hypofunction: Secondary | ICD-10-CM | POA: Diagnosis not present

## 2016-01-13 DIAGNOSIS — E291 Testicular hypofunction: Secondary | ICD-10-CM | POA: Diagnosis not present

## 2016-01-27 DIAGNOSIS — E291 Testicular hypofunction: Secondary | ICD-10-CM | POA: Diagnosis not present

## 2016-02-10 DIAGNOSIS — E291 Testicular hypofunction: Secondary | ICD-10-CM | POA: Diagnosis not present

## 2016-02-24 DIAGNOSIS — E291 Testicular hypofunction: Secondary | ICD-10-CM | POA: Diagnosis not present

## 2016-03-09 DIAGNOSIS — E291 Testicular hypofunction: Secondary | ICD-10-CM | POA: Diagnosis not present

## 2016-03-29 DIAGNOSIS — I10 Essential (primary) hypertension: Secondary | ICD-10-CM | POA: Diagnosis not present

## 2016-03-29 DIAGNOSIS — E119 Type 2 diabetes mellitus without complications: Secondary | ICD-10-CM | POA: Diagnosis not present

## 2016-03-29 DIAGNOSIS — Z23 Encounter for immunization: Secondary | ICD-10-CM | POA: Diagnosis not present

## 2016-03-29 DIAGNOSIS — N401 Enlarged prostate with lower urinary tract symptoms: Secondary | ICD-10-CM | POA: Diagnosis not present

## 2016-03-29 DIAGNOSIS — Z0000 Encounter for general adult medical examination without abnormal findings: Secondary | ICD-10-CM | POA: Diagnosis not present

## 2016-03-29 DIAGNOSIS — G6289 Other specified polyneuropathies: Secondary | ICD-10-CM | POA: Diagnosis not present

## 2016-04-05 DIAGNOSIS — Z0000 Encounter for general adult medical examination without abnormal findings: Secondary | ICD-10-CM | POA: Diagnosis not present

## 2016-04-05 DIAGNOSIS — E291 Testicular hypofunction: Secondary | ICD-10-CM | POA: Diagnosis not present

## 2016-04-05 DIAGNOSIS — I10 Essential (primary) hypertension: Secondary | ICD-10-CM | POA: Diagnosis not present

## 2016-04-05 DIAGNOSIS — E119 Type 2 diabetes mellitus without complications: Secondary | ICD-10-CM | POA: Diagnosis not present

## 2016-04-12 DIAGNOSIS — E291 Testicular hypofunction: Secondary | ICD-10-CM | POA: Diagnosis not present

## 2016-04-26 DIAGNOSIS — E291 Testicular hypofunction: Secondary | ICD-10-CM | POA: Diagnosis not present

## 2016-05-10 DIAGNOSIS — E291 Testicular hypofunction: Secondary | ICD-10-CM | POA: Diagnosis not present

## 2016-05-24 DIAGNOSIS — E291 Testicular hypofunction: Secondary | ICD-10-CM | POA: Diagnosis not present

## 2016-06-07 DIAGNOSIS — E291 Testicular hypofunction: Secondary | ICD-10-CM | POA: Diagnosis not present

## 2016-06-21 DIAGNOSIS — E291 Testicular hypofunction: Secondary | ICD-10-CM | POA: Diagnosis not present

## 2016-07-05 DIAGNOSIS — E291 Testicular hypofunction: Secondary | ICD-10-CM | POA: Diagnosis not present

## 2016-07-26 DIAGNOSIS — E291 Testicular hypofunction: Secondary | ICD-10-CM | POA: Diagnosis not present

## 2016-08-09 DIAGNOSIS — E119 Type 2 diabetes mellitus without complications: Secondary | ICD-10-CM | POA: Diagnosis not present

## 2016-08-09 DIAGNOSIS — E1165 Type 2 diabetes mellitus with hyperglycemia: Secondary | ICD-10-CM | POA: Diagnosis not present

## 2016-08-09 DIAGNOSIS — E291 Testicular hypofunction: Secondary | ICD-10-CM | POA: Diagnosis not present

## 2016-08-09 DIAGNOSIS — I10 Essential (primary) hypertension: Secondary | ICD-10-CM | POA: Diagnosis not present

## 2016-08-24 DIAGNOSIS — N529 Male erectile dysfunction, unspecified: Secondary | ICD-10-CM | POA: Diagnosis not present

## 2016-09-07 DIAGNOSIS — E291 Testicular hypofunction: Secondary | ICD-10-CM | POA: Diagnosis not present

## 2016-09-13 DIAGNOSIS — R079 Chest pain, unspecified: Secondary | ICD-10-CM | POA: Diagnosis not present

## 2016-09-16 DIAGNOSIS — E291 Testicular hypofunction: Secondary | ICD-10-CM | POA: Diagnosis not present

## 2016-09-28 DIAGNOSIS — Z23 Encounter for immunization: Secondary | ICD-10-CM | POA: Diagnosis not present

## 2016-09-28 DIAGNOSIS — E291 Testicular hypofunction: Secondary | ICD-10-CM | POA: Diagnosis not present

## 2016-10-12 DIAGNOSIS — E291 Testicular hypofunction: Secondary | ICD-10-CM | POA: Diagnosis not present

## 2016-10-24 ENCOUNTER — Encounter (HOSPITAL_COMMUNITY): Payer: Self-pay

## 2016-10-24 ENCOUNTER — Emergency Department (HOSPITAL_COMMUNITY)
Admission: EM | Admit: 2016-10-24 | Discharge: 2016-10-25 | Disposition: A | Discharge status: Home / Self Care | Payer: Federal, State, Local not specified - PPO | Attending: Emergency Medicine | Admitting: Emergency Medicine

## 2016-10-24 ENCOUNTER — Emergency Department (HOSPITAL_COMMUNITY): Payer: Federal, State, Local not specified - PPO

## 2016-10-24 DIAGNOSIS — R202 Paresthesia of skin: Secondary | ICD-10-CM | POA: Diagnosis not present

## 2016-10-24 DIAGNOSIS — E119 Type 2 diabetes mellitus without complications: Secondary | ICD-10-CM | POA: Insufficient documentation

## 2016-10-24 DIAGNOSIS — I10 Essential (primary) hypertension: Secondary | ICD-10-CM | POA: Diagnosis not present

## 2016-10-24 DIAGNOSIS — R200 Anesthesia of skin: Secondary | ICD-10-CM | POA: Diagnosis not present

## 2016-10-24 DIAGNOSIS — Z79899 Other long term (current) drug therapy: Secondary | ICD-10-CM | POA: Insufficient documentation

## 2016-10-24 DIAGNOSIS — R51 Headache: Secondary | ICD-10-CM | POA: Diagnosis not present

## 2016-10-24 LAB — I-STAT CHEM 8, ED
BUN: 23 mg/dL — ABNORMAL HIGH (ref 6–20)
Calcium, Ion: 1.11 mmol/L — ABNORMAL LOW (ref 1.15–1.40)
Chloride: 98 mmol/L — ABNORMAL LOW (ref 101–111)
Creatinine, Ser: 1.2 mg/dL (ref 0.61–1.24)
Glucose, Bld: 170 mg/dL — ABNORMAL HIGH (ref 65–99)
HCT: 52 % (ref 39.0–52.0)
Hemoglobin: 17.7 g/dL — ABNORMAL HIGH (ref 13.0–17.0)
Potassium: 3.9 mmol/L (ref 3.5–5.1)
SODIUM: 137 mmol/L (ref 135–145)
TCO2: 28 mmol/L (ref 22–32)

## 2016-10-24 LAB — CBC
HCT: 49.7 % (ref 39.0–52.0)
Hemoglobin: 17.3 g/dL — ABNORMAL HIGH (ref 13.0–17.0)
MCH: 29.8 pg (ref 26.0–34.0)
MCHC: 34.8 g/dL (ref 30.0–36.0)
MCV: 85.7 fL (ref 78.0–100.0)
PLATELETS: 112 10*3/uL — AB (ref 150–400)
RBC: 5.8 MIL/uL (ref 4.22–5.81)
RDW: 12.9 % (ref 11.5–15.5)
WBC: 7 10*3/uL (ref 4.0–10.5)

## 2016-10-24 LAB — DIFFERENTIAL
BASOS PCT: 0 %
Basophils Absolute: 0 10*3/uL (ref 0.0–0.1)
EOS ABS: 0.3 10*3/uL (ref 0.0–0.7)
Eosinophils Relative: 5 %
LYMPHS PCT: 22 %
Lymphs Abs: 1.5 10*3/uL (ref 0.7–4.0)
MONO ABS: 0.6 10*3/uL (ref 0.1–1.0)
Monocytes Relative: 9 %
NEUTROS PCT: 65 %
Neutro Abs: 4.5 10*3/uL (ref 1.7–7.7)

## 2016-10-24 LAB — COMPREHENSIVE METABOLIC PANEL
ALT: 18 U/L (ref 17–63)
AST: 23 U/L (ref 15–41)
Albumin: 4 g/dL (ref 3.5–5.0)
Alkaline Phosphatase: 36 U/L — ABNORMAL LOW (ref 38–126)
Anion gap: 9 (ref 5–15)
BUN: 20 mg/dL (ref 6–20)
CALCIUM: 9.1 mg/dL (ref 8.9–10.3)
CHLORIDE: 100 mmol/L — AB (ref 101–111)
CO2: 27 mmol/L (ref 22–32)
Creatinine, Ser: 1.27 mg/dL — ABNORMAL HIGH (ref 0.61–1.24)
GFR calc non Af Amer: 60 mL/min — ABNORMAL LOW (ref >60)
GLUCOSE: 177 mg/dL — AB (ref 65–99)
Potassium: 4.2 mmol/L (ref 3.5–5.1)
Sodium: 136 mmol/L (ref 135–145)
Total Bilirubin: 1.3 mg/dL — ABNORMAL HIGH (ref 0.3–1.2)
Total Protein: 6.9 g/dL (ref 6.5–8.1)

## 2016-10-24 LAB — I-STAT TROPONIN, ED: Troponin i, poc: 0.01 ng/mL (ref 0.00–0.08)

## 2016-10-24 LAB — APTT: aPTT: 26 seconds (ref 24–36)

## 2016-10-24 LAB — PROTIME-INR
INR: 1.01
Prothrombin Time: 13.2 seconds (ref 11.4–15.2)

## 2016-10-24 NOTE — ED Provider Notes (Signed)
[facility] EMERGENCY DEPARTMENT Provider Note   CSN: [id number] Arrival date & time: 10/24/16  1630     History   Chief Complaint Chief Complaint  Patient presents with  . Tingling  . Hypertension    HPI Patrick Buckley is a 61 y.o. male.  The history is provided by the patient and medical records.  Hypertension     61 y.o. M with hx of BPH, HTN, DM, presenting to the ED for HTN and tingling on right side of his face.  Patient reports yesterday morning on his way to church around 10am he noticed that he was having some "tingling" sensation on the right side of his face, particularly around the eye.  States it persisted throughout the day and seemed to be spreading to the cheek and around the right side of jaw.  No tingling on left.  No facial droop, drooling, difficulty swallowing, numbness, weakness, blurred vision, difficulty walking, changes in speech, or dizziness.  States he also has noticed that his BP has been running high.  The highest record at home was 160's/90's on his home cuff.  States he has been complaint with his medications, BP's usually in the 120's/70's range on this medication.  No changes in other medications/dosage.  States a few days ago he did fell like he may have been getting a cold.  Did take one dose of alka seltzer and nyquil yesterday but no OTC cold medications today.  No prior hx of TIA or stroke.  Not currently on anticoagulation.  Past Medical History:  Diagnosis Date  . BPH (benign prostatic hypertrophy)   . Frequency of urination   . History of kidney stones   . Hypertension   . Incomplete emptying of bladder   . Neuropathy    feet  . Type 2 diabetes, diet controlled (HCC)   . Urgency of urination     There are no active problems to display for this patient.   Past Surgical History:  Procedure Laterality Date  . GREEN LIGHT LASER TURP (TRANSURETHRAL RESECTION OF PROSTATE N/A 06/13/2014   Procedure: GREEN LIGHT LASER  TURP (TRANSURETHRAL RESECTION OF PROSTATE WITH URETHRAL DILITATION;  Surgeon: Jerilee Field, MD;  Location: [facility];  Service: Urology;  Laterality: N/A;  . NO PAST SURGERIES         Home Medications    Prior to Admission medications   Medication Sig Start Date End Date Taking? Authorizing Provider  nitrofurantoin (MACRODANTIN) 100 MG capsule Take 100 mg by mouth every morning.    [provider]  oxyCODONE-acetaminophen (ROXICET) 5-325 MG per tablet Take 1-2 tablets by mouth every 6 (six) hours as needed for severe pain. 06/13/14   Jerilee Field, MD  tamsulosin (FLOMAX) 0.4 MG CAPS capsule Take 0.4 mg by mouth daily after breakfast.    [provider]  valsartan-hydrochlorothiazide (DIOVAN-HCT) 80-12.5 MG per tablet Take 1 tablet by mouth every morning.     [provider]    Family History History reviewed. No pertinent family history.  Social History Social History  Substance Use Topics  . Smoking status: Never Smoker  . Smokeless tobacco: Never Used  . Alcohol use Yes     Comment: rare     Allergies   Ace inhibitors and Amlodipine besylate   Review of Systems Review of Systems  Neurological: Positive for numbness (tingling).  All other systems reviewed and are negative.    Physical Exam Updated Vital Signs BP (!) 186/103  Pulse 97   Temp 98.4 F (36.9 C)   Resp 12   SpO2 98%   Physical Exam  Constitutional: He is oriented to person, place, and time. He appears well-developed and well-nourished. No distress.  HENT:  Head: Normocephalic and atraumatic.  Right Ear: Tympanic membrane and external ear normal.  Left Ear: Tympanic membrane and external ear normal.  Nose: Nose normal. Right sinus exhibits no frontal sinus tenderness. Left sinus exhibits no maxillary sinus tenderness and no frontal sinus tenderness.  Mouth/Throat: Oropharynx is clear and moist.  Face appears flushed but no cellulitic changes or  rash, no sinus tenderness on exam  Eyes: Pupils are equal, round, and reactive to light. Conjunctivae and EOM are normal.  No drainage tearing, no conjunctival injection or hemorrhage, no drainage; EOMs fully intact  Neck: Normal range of motion and full passive range of motion without pain. Neck supple. No neck rigidity.  No rigidity, no meningismus  Cardiovascular: Normal rate, regular rhythm and normal heart sounds.   No murmur heard. Pulmonary/Chest: Effort normal and breath sounds normal. No respiratory distress. He has no wheezes. He has no rhonchi.  Abdominal: Soft. Bowel sounds are normal. There is no tenderness. There is no rebound and no guarding.  Musculoskeletal: Normal range of motion. He exhibits no edema.  Neurological: He is alert and oriented to person, place, and time. He has normal strength. He displays no tremor. No cranial nerve deficit or sensory deficit. He displays no seizure activity.  AAOx3, answering questions and following commands appropriately; equal strength UE and LE bilaterally; moves all extremities appropriately without ataxia; normal heel to shin and finger to nose bilaterally; no pronator drift; decreased sensation along right side of face when compared with left; normal tongue protrusion, normal speech without slurring, no facial droop  Skin: Skin is warm and dry. No rash noted. He is not diaphoretic.  Psychiatric: He has a normal mood and affect. His behavior is normal. Thought content normal.  Nursing note and vitals reviewed.    ED Treatments / Results  Labs (all labs ordered are listed, but only abnormal results are displayed) Labs Reviewed  CBC - Abnormal; Notable for the following:       Result Value   Hemoglobin 17.3 (*)    Platelets 112 (*)    All other components within normal limits  COMPREHENSIVE METABOLIC PANEL - Abnormal; Notable for the following:    Chloride 100 (*)    Glucose, Bld 177 (*)    Creatinine, Ser 1.27 (*)    Alkaline  Phosphatase 36 (*)    Total Bilirubin 1.3 (*)    GFR calc non Af Amer 60 (*)    All other components within normal limits  I-STAT CHEM 8, ED - Abnormal; Notable for the following:    Chloride 98 (*)    BUN 23 (*)    Glucose, Bld 170 (*)    Calcium, Ion 1.11 (*)    Hemoglobin 17.7 (*)    All other components within normal limits  PROTIME-INR  APTT  DIFFERENTIAL  I-STAT TROPONIN, ED  CBG MONITORING, ED    EKG  EKG Interpretation  Date/Time:  Monday October 24 2016 16:42:10 EDT Ventricular Rate:  87 PR Interval:  160 QRS Duration: 96 QT Interval:  364 QTC Calculation: 438 R Axis:   53 Text Interpretation:  Normal sinus rhythm Normal ECG No significant change since last tracing Confirmed by Alvira MondaySchlossman, Erin ([id number]) on 10/24/2016 7:57:29 PM  Radiology Ct Head Wo Contrast  Result Date: 10/24/2016 CLINICAL DATA:  Right facial numbness and hypertension since yesterday. EXAM: CT HEAD WITHOUT CONTRAST TECHNIQUE: Contiguous axial images were obtained from the base of the skull through the vertex without intravenous contrast. COMPARISON:  None. FINDINGS: Brain: Normal appearing cerebral hemispheres and posterior fossa structures. Normal size and position of the ventricles. No intracranial hemorrhage, mass lesion or CT evidence of acute infarction. Vascular: No hyperdense vessel or unexpected calcification. Skull: Normal. Negative for fracture or focal lesion. Sinuses/Orbits: Unremarkable. Other: None. IMPRESSION: Normal examination. Electronically Signed   By: Beckie Salts M.D.   On: 10/24/2016 18:00   Mr Brain Wo Contrast  Result Date: 10/25/2016 CLINICAL DATA:  61 y/o  M; facial pain and facial tingling. EXAM: MRI HEAD WITHOUT CONTRAST TECHNIQUE: Multiplanar, multiecho pulse sequences of the brain and surrounding structures were obtained without intravenous contrast. COMPARISON:  10/24/2016 CT head FINDINGS: Brain: No acute infarction, hemorrhage, hydrocephalus, extra-axial  collection or mass lesion. Vascular: Normal flow voids. Skull and upper cervical spine: Normal marrow signal. Sinuses/Orbits: Negative. Other: None. IMPRESSION: No acute intracranial abnormality identified. Unremarkable MRI of the head. Electronically Signed   By: Mitzi Hansen M.D.   On: 10/25/2016 01:44    Procedures Procedures (including critical care time)  Medications Ordered in ED Medications - No data to display   Initial Impression / Assessment and Plan / ED Course  I have reviewed the triage vital signs and the nursing notes.  Pertinent labs & imaging results that were available during my care of the patient were reviewed by me and considered in my medical decision making (see chart for details).  61 year old male here with high blood pressure and tingling on the right side of his face as well as elevated BP. Reports he felt he was getting a cold a few days ago, did use over-the-counter Alka-Seltzer and NyQuil.  Patient's screening labs and head CT obtained from triage are overall reassuring. On exam he does have some decreased sensation along the right side of his face compared with the left. I do not appreciate any signs of overlying skin infection, cellulitis, or rash of face.  No conjunctival injection or hemorrhage.  No drainage of the eye.  No sinus tenderness.  No other neurologic deficits noted. Patient's blood pressure on repeat exam 186/103. He reports this is than his baseline. May be secondary to use of over-the-counter medications which we have discussed.  No signs of acute end organ damage at this time. Nonetheless, given his unrelenting symptoms of facial tingling, will proceed with MRI to ensure no acute stroke.  MRI obtained, no acute findings.  Results discussed with patient and his wife.  Given his negative workup here, I do feel he is stable for discharge home. We will refer to neurology for ongoing evaluation. Also recommended that he follow-up with his  primary care doctor as well. We have discussed continuing to monitor his blood pressure at home, avoiding over-the-counter cold medications. I discussed using Coricidin HBP if needed for congestion symptoms.  Discussed plan with patient and his wife, they acknowledged understanding and agreed with plan of care.  Return precautions given for new or worsening symptoms.  Final Clinical Impressions(s) / ED Diagnoses   Final diagnoses:  Tingling of face    New Prescriptions Discharge Medication List as of 10/25/2016  2:51 AM       Garlon Hatchet, PA-C 10/25/16 0321    Lavera Guise, MD 10/25/16 [phone]

## 2016-10-24 NOTE — ED Triage Notes (Signed)
Pt states he began having tingling in the face yesterday on the way to church. [facility] and [facility] 170/110. He also reports some facial pains. Pt AOX4. Hypertensive in triage.

## 2016-10-25 ENCOUNTER — Emergency Department (HOSPITAL_COMMUNITY): Payer: Federal, State, Local not specified - PPO

## 2016-10-25 DIAGNOSIS — R51 Headache: Secondary | ICD-10-CM | POA: Diagnosis not present

## 2016-10-25 LAB — CBG MONITORING, ED: GLUCOSE-CAPILLARY: 165 mg/dL — AB (ref 65–99)

## 2016-10-25 NOTE — ED Notes (Signed)
Pt stable, ambulatory, states understanding of discharge instructions 

## 2016-10-25 NOTE — Discharge Instructions (Signed)
Would continue your regular medications.  Hold off on any more over the counter cold medications as it can increase your blood pressure.  Continue to monitor your readings at home. Follow-up with neurology if you continue to have ongoing symptoms. Can also see your primary care doctor. Return to the ED for new or worsening symptoms.

## 2016-10-25 NOTE — ED Notes (Signed)
Patient transported to MRI 

## 2016-10-25 NOTE — ED Notes (Signed)
Pt not back from MRI yet

## 2016-10-28 DIAGNOSIS — B0231 Zoster conjunctivitis: Secondary | ICD-10-CM | POA: Diagnosis not present

## 2016-10-28 DIAGNOSIS — B0230 Zoster ocular disease, unspecified: Secondary | ICD-10-CM | POA: Diagnosis not present

## 2016-11-01 DIAGNOSIS — B0230 Zoster ocular disease, unspecified: Secondary | ICD-10-CM | POA: Diagnosis not present

## 2016-11-09 DIAGNOSIS — E291 Testicular hypofunction: Secondary | ICD-10-CM | POA: Diagnosis not present

## 2016-11-23 DIAGNOSIS — E291 Testicular hypofunction: Secondary | ICD-10-CM | POA: Diagnosis not present

## 2016-12-08 DIAGNOSIS — E119 Type 2 diabetes mellitus without complications: Secondary | ICD-10-CM | POA: Diagnosis not present

## 2016-12-08 DIAGNOSIS — E291 Testicular hypofunction: Secondary | ICD-10-CM | POA: Diagnosis not present

## 2016-12-08 DIAGNOSIS — B029 Zoster without complications: Secondary | ICD-10-CM | POA: Diagnosis not present

## 2016-12-08 DIAGNOSIS — I10 Essential (primary) hypertension: Secondary | ICD-10-CM | POA: Diagnosis not present

## 2017-01-18 DIAGNOSIS — E291 Testicular hypofunction: Secondary | ICD-10-CM | POA: Diagnosis not present

## 2017-02-02 DIAGNOSIS — E291 Testicular hypofunction: Secondary | ICD-10-CM | POA: Diagnosis not present

## 2017-02-13 DIAGNOSIS — H1045 Other chronic allergic conjunctivitis: Secondary | ICD-10-CM | POA: Diagnosis not present

## 2017-02-15 DIAGNOSIS — E291 Testicular hypofunction: Secondary | ICD-10-CM | POA: Diagnosis not present

## 2017-03-01 DIAGNOSIS — E291 Testicular hypofunction: Secondary | ICD-10-CM | POA: Diagnosis not present

## 2017-03-06 DIAGNOSIS — H11002 Unspecified pterygium of left eye: Secondary | ICD-10-CM | POA: Diagnosis not present

## 2017-03-15 DIAGNOSIS — E291 Testicular hypofunction: Secondary | ICD-10-CM | POA: Diagnosis not present

## 2017-03-29 DIAGNOSIS — E291 Testicular hypofunction: Secondary | ICD-10-CM | POA: Diagnosis not present

## 2017-03-29 DIAGNOSIS — I10 Essential (primary) hypertension: Secondary | ICD-10-CM | POA: Diagnosis not present

## 2017-03-29 DIAGNOSIS — Z5181 Encounter for therapeutic drug level monitoring: Secondary | ICD-10-CM | POA: Diagnosis not present

## 2017-03-29 DIAGNOSIS — R972 Elevated prostate specific antigen [PSA]: Secondary | ICD-10-CM | POA: Diagnosis not present

## 2017-03-29 DIAGNOSIS — Z125 Encounter for screening for malignant neoplasm of prostate: Secondary | ICD-10-CM | POA: Diagnosis not present

## 2017-03-29 DIAGNOSIS — Z0000 Encounter for general adult medical examination without abnormal findings: Secondary | ICD-10-CM | POA: Diagnosis not present

## 2017-03-29 DIAGNOSIS — E669 Obesity, unspecified: Secondary | ICD-10-CM | POA: Diagnosis not present

## 2017-03-29 DIAGNOSIS — E119 Type 2 diabetes mellitus without complications: Secondary | ICD-10-CM | POA: Diagnosis not present

## 2017-04-19 DIAGNOSIS — E291 Testicular hypofunction: Secondary | ICD-10-CM | POA: Diagnosis not present

## 2017-05-10 DIAGNOSIS — R972 Elevated prostate specific antigen [PSA]: Secondary | ICD-10-CM | POA: Diagnosis not present

## 2017-05-10 DIAGNOSIS — N401 Enlarged prostate with lower urinary tract symptoms: Secondary | ICD-10-CM | POA: Diagnosis not present

## 2017-05-10 DIAGNOSIS — E291 Testicular hypofunction: Secondary | ICD-10-CM | POA: Diagnosis not present

## 2017-05-10 DIAGNOSIS — R3915 Urgency of urination: Secondary | ICD-10-CM | POA: Diagnosis not present

## 2017-05-24 DIAGNOSIS — E291 Testicular hypofunction: Secondary | ICD-10-CM | POA: Diagnosis not present

## 2017-06-07 DIAGNOSIS — E291 Testicular hypofunction: Secondary | ICD-10-CM | POA: Diagnosis not present

## 2017-06-21 DIAGNOSIS — E291 Testicular hypofunction: Secondary | ICD-10-CM | POA: Diagnosis not present

## 2017-07-05 DIAGNOSIS — E291 Testicular hypofunction: Secondary | ICD-10-CM | POA: Diagnosis not present

## 2017-07-19 DIAGNOSIS — E291 Testicular hypofunction: Secondary | ICD-10-CM | POA: Diagnosis not present

## 2017-07-25 DIAGNOSIS — E1169 Type 2 diabetes mellitus with other specified complication: Secondary | ICD-10-CM | POA: Diagnosis not present

## 2017-07-25 DIAGNOSIS — I10 Essential (primary) hypertension: Secondary | ICD-10-CM | POA: Diagnosis not present

## 2017-07-25 DIAGNOSIS — R972 Elevated prostate specific antigen [PSA]: Secondary | ICD-10-CM | POA: Diagnosis not present

## 2017-07-25 DIAGNOSIS — E291 Testicular hypofunction: Secondary | ICD-10-CM | POA: Diagnosis not present

## 2017-08-09 DIAGNOSIS — E291 Testicular hypofunction: Secondary | ICD-10-CM | POA: Diagnosis not present

## 2017-08-23 DIAGNOSIS — E291 Testicular hypofunction: Secondary | ICD-10-CM | POA: Diagnosis not present

## 2017-08-29 DIAGNOSIS — R300 Dysuria: Secondary | ICD-10-CM | POA: Diagnosis not present

## 2017-09-18 DIAGNOSIS — N3001 Acute cystitis with hematuria: Secondary | ICD-10-CM | POA: Diagnosis not present

## 2017-10-09 DIAGNOSIS — N390 Urinary tract infection, site not specified: Secondary | ICD-10-CM | POA: Diagnosis not present

## 2017-11-28 DIAGNOSIS — I10 Essential (primary) hypertension: Secondary | ICD-10-CM | POA: Diagnosis not present

## 2017-11-28 DIAGNOSIS — E291 Testicular hypofunction: Secondary | ICD-10-CM | POA: Diagnosis not present

## 2017-11-28 DIAGNOSIS — E119 Type 2 diabetes mellitus without complications: Secondary | ICD-10-CM | POA: Diagnosis not present

## 2018-05-11 DIAGNOSIS — D485 Neoplasm of uncertain behavior of skin: Secondary | ICD-10-CM | POA: Diagnosis not present

## 2018-05-11 DIAGNOSIS — L570 Actinic keratosis: Secondary | ICD-10-CM | POA: Diagnosis not present

## 2018-05-11 DIAGNOSIS — D225 Melanocytic nevi of trunk: Secondary | ICD-10-CM | POA: Diagnosis not present

## 2018-06-20 DIAGNOSIS — D485 Neoplasm of uncertain behavior of skin: Secondary | ICD-10-CM | POA: Diagnosis not present

## 2018-06-20 DIAGNOSIS — D225 Melanocytic nevi of trunk: Secondary | ICD-10-CM | POA: Diagnosis not present

## 2018-06-28 DIAGNOSIS — Z125 Encounter for screening for malignant neoplasm of prostate: Secondary | ICD-10-CM | POA: Diagnosis not present

## 2018-06-28 DIAGNOSIS — E1169 Type 2 diabetes mellitus with other specified complication: Secondary | ICD-10-CM | POA: Diagnosis not present

## 2018-06-28 DIAGNOSIS — E1165 Type 2 diabetes mellitus with hyperglycemia: Secondary | ICD-10-CM | POA: Diagnosis not present

## 2018-06-28 DIAGNOSIS — R6889 Other general symptoms and signs: Secondary | ICD-10-CM | POA: Diagnosis not present

## 2018-06-28 DIAGNOSIS — E291 Testicular hypofunction: Secondary | ICD-10-CM | POA: Diagnosis not present

## 2018-06-28 DIAGNOSIS — I10 Essential (primary) hypertension: Secondary | ICD-10-CM | POA: Diagnosis not present

## 2018-06-28 DIAGNOSIS — Z0001 Encounter for general adult medical examination with abnormal findings: Secondary | ICD-10-CM | POA: Diagnosis not present

## 2018-06-28 DIAGNOSIS — E781 Pure hyperglyceridemia: Secondary | ICD-10-CM | POA: Diagnosis not present

## 2018-07-11 DIAGNOSIS — E291 Testicular hypofunction: Secondary | ICD-10-CM | POA: Diagnosis not present

## 2018-07-25 DIAGNOSIS — E291 Testicular hypofunction: Secondary | ICD-10-CM | POA: Diagnosis not present

## 2018-08-08 DIAGNOSIS — E291 Testicular hypofunction: Secondary | ICD-10-CM | POA: Diagnosis not present

## 2018-08-22 DIAGNOSIS — E291 Testicular hypofunction: Secondary | ICD-10-CM | POA: Diagnosis not present

## 2018-09-05 DIAGNOSIS — E291 Testicular hypofunction: Secondary | ICD-10-CM | POA: Diagnosis not present

## 2018-09-19 DIAGNOSIS — E291 Testicular hypofunction: Secondary | ICD-10-CM | POA: Diagnosis not present

## 2018-10-03 DIAGNOSIS — E291 Testicular hypofunction: Secondary | ICD-10-CM | POA: Diagnosis not present

## 2018-10-17 DIAGNOSIS — E291 Testicular hypofunction: Secondary | ICD-10-CM | POA: Diagnosis not present

## 2018-10-30 DIAGNOSIS — I10 Essential (primary) hypertension: Secondary | ICD-10-CM | POA: Diagnosis not present

## 2018-10-30 DIAGNOSIS — E1169 Type 2 diabetes mellitus with other specified complication: Secondary | ICD-10-CM | POA: Diagnosis not present

## 2018-10-30 DIAGNOSIS — E291 Testicular hypofunction: Secondary | ICD-10-CM | POA: Diagnosis not present

## 2018-10-30 DIAGNOSIS — Z23 Encounter for immunization: Secondary | ICD-10-CM | POA: Diagnosis not present

## 2018-10-31 DIAGNOSIS — E291 Testicular hypofunction: Secondary | ICD-10-CM | POA: Diagnosis not present

## 2018-11-14 DIAGNOSIS — E291 Testicular hypofunction: Secondary | ICD-10-CM | POA: Diagnosis not present

## 2018-11-28 DIAGNOSIS — E291 Testicular hypofunction: Secondary | ICD-10-CM | POA: Diagnosis not present

## 2019-01-14 DIAGNOSIS — M791 Myalgia, unspecified site: Secondary | ICD-10-CM | POA: Diagnosis not present

## 2019-01-22 ENCOUNTER — Emergency Department (HOSPITAL_COMMUNITY)
Admission: EM | Admit: 2019-01-22 | Discharge: 2019-01-23 | Disposition: A | Discharge status: Home / Self Care | Payer: Federal, State, Local not specified - PPO | Attending: Emergency Medicine | Admitting: Emergency Medicine

## 2019-01-22 ENCOUNTER — Encounter (HOSPITAL_COMMUNITY): Payer: Self-pay

## 2019-01-22 DIAGNOSIS — R000 Tachycardia, unspecified: Secondary | ICD-10-CM | POA: Diagnosis not present

## 2019-01-22 DIAGNOSIS — U071 COVID-19: Secondary | ICD-10-CM | POA: Diagnosis not present

## 2019-01-22 DIAGNOSIS — E119 Type 2 diabetes mellitus without complications: Secondary | ICD-10-CM | POA: Insufficient documentation

## 2019-01-22 DIAGNOSIS — Z20828 Contact with and (suspected) exposure to other viral communicable diseases: Secondary | ICD-10-CM | POA: Diagnosis not present

## 2019-01-22 DIAGNOSIS — R05 Cough: Secondary | ICD-10-CM | POA: Diagnosis not present

## 2019-01-22 DIAGNOSIS — J189 Pneumonia, unspecified organism: Secondary | ICD-10-CM | POA: Diagnosis not present

## 2019-01-22 LAB — CBC
HCT: 45.6 % (ref 39.0–52.0)
Hemoglobin: 15.3 g/dL (ref 13.0–17.0)
MCH: 28.8 pg (ref 26.0–34.0)
MCHC: 33.6 g/dL (ref 30.0–36.0)
MCV: 85.9 fL (ref 80.0–100.0)
Platelets: 142 10*3/uL — ABNORMAL LOW (ref 150–400)
RBC: 5.31 MIL/uL (ref 4.22–5.81)
RDW: 13.4 % (ref 11.5–15.5)
WBC: 7.6 10*3/uL (ref 4.0–10.5)
nRBC: 0 % (ref 0.0–0.2)

## 2019-01-22 LAB — BASIC METABOLIC PANEL
Anion gap: 14 (ref 5–15)
BUN: 30 mg/dL — ABNORMAL HIGH (ref 8–23)
CO2: 25 mmol/L (ref 22–32)
Calcium: 9.5 mg/dL (ref 8.9–10.3)
Chloride: 98 mmol/L (ref 98–111)
Creatinine, Ser: 1.67 mg/dL — ABNORMAL HIGH (ref 0.61–1.24)
GFR calc Af Amer: 50 mL/min — ABNORMAL LOW (ref >60)
GFR calc non Af Amer: 43 mL/min — ABNORMAL LOW (ref >60)
Glucose, Bld: 341 mg/dL — ABNORMAL HIGH (ref 70–99)
Potassium: 4.2 mmol/L (ref 3.5–5.1)
Sodium: 137 mmol/L (ref 135–145)

## 2019-01-22 NOTE — ED Triage Notes (Signed)
Pt reports that he tested positive for Covid last Monday, pt reports that his doctor sent him to get a chest x ray and it showed he has pneumonia and they told him to come here, pt denies SOB except for when walking. C/o of fatigue, chills and nausea

## 2019-01-23 MED ORDER — ONDANSETRON 4 MG PO TBDP: 4.0000 mg | ORAL_TABLET | Freq: Three times a day (TID) | ORAL | 0 refills | Status: DC | PRN

## 2019-01-23 MED ORDER — BENZONATATE 100 MG PO CAPS: 100.0000 mg | ORAL_CAPSULE | Freq: Three times a day (TID) | ORAL | 0 refills | Status: DC

## 2019-01-23 NOTE — ED Provider Notes (Signed)
[facility] EMERGENCY DEPARTMENT Provider Note   CSN: [id number] Arrival date & time: 01/22/19  2051     History Chief Complaint  Patient presents with  . COVID    Patrick Buckley is a 64 y.o. male.  64 yo M with a chief complaint of cough congestion fevers chills myalgias headache nausea.  This been going on for about 8 or 9 days.  Was diagnosed with the novel coronavirus at the onset.  Felt that his symptoms got much worse and so called his doctor who suggested he go to urgent care.  At urgent care he got an x-ray that showed pneumonia and he was told he should come to the ED for evaluation.  The history is provided by the patient.  Illness Severity:  Moderate Onset quality:  Gradual Duration:  8 days Timing:  Constant Progression:  Worsening Chronicity:  New Associated symptoms: cough, fever, headaches, myalgias and nausea   Associated symptoms: no abdominal pain, no chest pain, no congestion, no diarrhea, no rash, no shortness of breath and no vomiting        Past Medical History:  Diagnosis Date  . BPH (benign prostatic hypertrophy)   . Frequency of urination   . History of kidney stones   . Hypertension   . Incomplete emptying of bladder   . Neuropathy    feet  . Type 2 diabetes, diet controlled ([place])   . Urgency of urination     There are no problems to display for this patient.   Past Surgical History:  Procedure Laterality Date  . GREEN LIGHT LASER TURP (TRANSURETHRAL RESECTION OF PROSTATE N/A 06/13/2014   Procedure: GREEN LIGHT LASER TURP (TRANSURETHRAL RESECTION OF PROSTATE WITH URETHRAL DILITATION;  Surgeon: Festus Aloe, MD;  Location: [facility];  Service: Urology;  Laterality: N/A;  . NO PAST SURGERIES         No family history on file.  Social History   Tobacco Use  . Smoking status: Never Smoker  . Smokeless tobacco: Never Used  Substance Use Topics  . Alcohol use: Yes    Comment: rare  . Drug  use: No    Home Medications Prior to Admission medications   Medication Sig Start Date End Date Taking? Authorizing Provider  benzonatate (TESSALON) 100 MG capsule Take 1 capsule (100 mg total) by mouth every 8 (eight) hours. 01/23/19   Deno Etienne, DO  ondansetron (ZOFRAN ODT) 4 MG disintegrating tablet Take 1 tablet (4 mg total) by mouth every 8 (eight) hours as needed for nausea or vomiting. 01/23/19   Deno Etienne, DO  oxyCODONE-acetaminophen (ROXICET) 5-325 MG per tablet Take 1-2 tablets by mouth every 6 (six) hours as needed for severe pain. Patient not taking: Reported on 10/24/2016 06/13/14   Festus Aloe, MD  valsartan-hydrochlorothiazide (DIOVAN-HCT) 320-12.5 MG tablet Take 1 tablet by mouth daily. 10/03/16   [provider]    Allergies    Ace inhibitors and Amlodipine besylate  Review of Systems   Review of Systems  Constitutional: Positive for chills and fever.  HENT: Negative for congestion and facial swelling.   Eyes: Negative for discharge and visual disturbance.  Respiratory: Positive for cough. Negative for shortness of breath.   Cardiovascular: Negative for chest pain and palpitations.  Gastrointestinal: Positive for nausea. Negative for abdominal pain, diarrhea and vomiting.  Musculoskeletal: Positive for myalgias. Negative for arthralgias.  Skin: Negative for color change and rash.  Neurological: Positive for headaches. Negative for tremors and  syncope.  Psychiatric/Behavioral: Negative for confusion and dysphoric mood.    Physical Exam Updated Vital Signs BP 126/77 (BP Location: Left Arm)   Pulse (!) 102   Temp 97.6 F (36.4 C) (Oral)   Resp 18   SpO2 94%   Physical Exam Vitals and nursing note reviewed.  Constitutional:      Appearance: He is well-developed.  HENT:     Head: Normocephalic and atraumatic.  Eyes:     Pupils: Pupils are equal, round, and reactive to light.  Neck:     Vascular: No JVD.  Cardiovascular:     Rate and Rhythm:  Normal rate and regular rhythm.     Heart sounds: No murmur. No friction rub. No gallop.   Pulmonary:     Effort: No respiratory distress.     Breath sounds: No wheezing.  Abdominal:     General: There is no distension.     Tenderness: There is no abdominal tenderness. There is no guarding or rebound.  Musculoskeletal:        General: Normal range of motion.     Cervical back: Normal range of motion and neck supple.  Skin:    Coloration: Skin is not pale.     Findings: No rash.  Neurological:     Mental Status: He is alert and oriented to person, place, and time.  Psychiatric:        Behavior: Behavior normal.     ED Results / Procedures / Treatments   Labs (all labs ordered are listed, but only abnormal results are displayed) Labs Reviewed  CBC - Abnormal; Notable for the following components:      Result Value   Platelets 142 (*)    All other components within normal limits  BASIC METABOLIC PANEL - Abnormal; Notable for the following components:   Glucose, Bld 341 (*)    BUN 30 (*)    Creatinine, Ser 1.67 (*)    GFR calc non Af Amer 43 (*)    GFR calc Af Amer 50 (*)    All other components within normal limits    EKG EKG Interpretation  Date/Time:  Tuesday January 22 2019 21:23:17 EST Ventricular Rate:  118 PR Interval:  148 QRS Duration: 90 QT Interval:  348 QTC Calculation: 487 R Axis:   66 Text Interpretation: Sinus tachycardia Otherwise normal ECG Since last tracing rate faster Otherwise no significant change Confirmed by Melene Plan [id number]) on 01/22/2019 11:28:18 PM   Radiology No results found.  Procedures Procedures (including critical care time)  Medications Ordered in ED Medications - No data to display  ED Course  I have reviewed the triage vital signs and the nursing notes.  Pertinent labs & imaging results that were available during my care of the patient were reviewed by me and considered in my medical decision making (see chart for  details).    MDM Rules/Calculators/A&P                      64 yo M with a chief complaints of cough congestion fevers chills myalgias nausea.  Is been going on for about 8 days.  At the onset he was diagnosed with the novel coronavirus.  Feels that his symptoms had worsened and so called his family doctor who suggested he go to urgent care.  At urgent care he had had an x-ray obtained that showed a viral pneumonia and he was told to come to the ED for evaluation.  Patient is well-appearing and nontoxic he is able to ambulate without hypoxia or shortness of breath.  He has no abdominal tenderness on exam.  He has mild hyperglycemia and mild renal dysfunction.  He is able to tolerate p.o. in the ED.  I discussed with him different scenarios that would require admission for the novel coronavirus.  I discussed with him why we would typically try to keep him at home if possible.  I am unsure why he was sent from urgent care for ED evaluation.  I personally reviewed the x-ray that was done at urgent care.  My view consistent with viral pneumonia.  Will prescribe cough and nausea medicine.  Have him do symptomatic therapy at home.  PCP follow-up.  12:19 AM:  I have discussed the diagnosis/risks/treatment options with the patient and believe the pt to be eligible for discharge home to follow-up with PCP. We also discussed returning to the ED immediately if new or worsening sx occur. We discussed the sx which are most concerning (e.g., sudden worsening pain, fever, inability to tolerate by mouth) that necessitate immediate return. Medications administered to the patient during their visit and any new prescriptions provided to the patient are listed below.  Medications given during this visit Medications - No data to display   The patient appears reasonably screen and/or stabilized for discharge and I doubt any other medical condition or other [facility] requiring further screening, evaluation, or treatment in the ED  at this time prior to discharge.   Final Clinical Impression(s) / ED Diagnoses Final diagnoses:  COVID-19 virus infection    Rx / DC Orders ED Discharge Orders         Ordered    benzonatate (TESSALON) 100 MG capsule  Every 8 hours     01/23/19 0013    ondansetron (ZOFRAN ODT) 4 MG disintegrating tablet  Every 8 hours PRN     01/23/19 0013           Melene Plan, DO 01/23/19 0019

## 2019-01-23 NOTE — ED Notes (Signed)
Verbal discharge to patient, signature not obtained. Pt verbalized understanding of at home treatment of Covid-19 and to return to the ER if he has a worsening of symptoms.

## 2019-01-23 NOTE — Discharge Instructions (Signed)
Take tylenol 2 pills 4 times a day and motrin 4 pills 3 times a day.  Drink plenty of fluids.  Return for worsening shortness of breath, headache, confusion. Follow up with your family doctor.   

## 2019-02-07 IMAGING — MR MR HEAD W/O CM
9 of 10 series · 35 of 48 positions shown · non-contrast
Comparison: 10/24/2016 CT head

CLINICAL DATA: 60 y/o  M; facial pain and facial tingling.

EXAM:
MRI HEAD WITHOUT CONTRAST
TECHNIQUE: Multiplanar, multiecho pulse sequences of the brain and surrounding
structures were obtained without intravenous contrast.

[Series 4: DWI · axial · 3.0mm · 0.94mm/px · z∈[-55,+86]mm · 8 of 100 slices shown (1 of 2)]
[im 1/100]
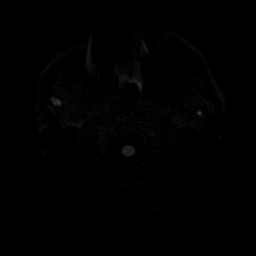
[im 12/100]
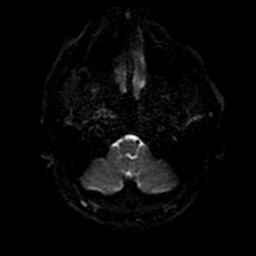
[im 34/100]
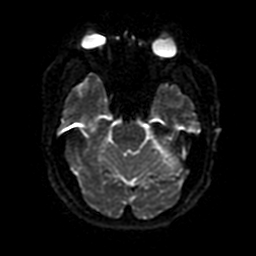
[im 45/100]
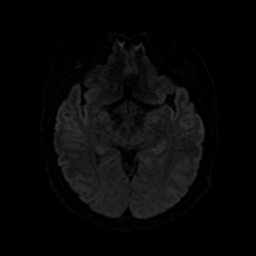
[im 56/100]
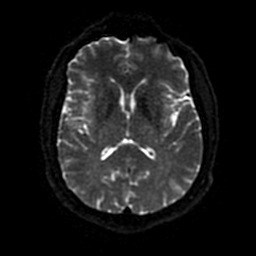
[im 67/100]
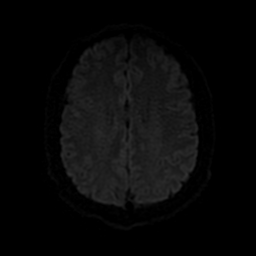
[im 89/100]
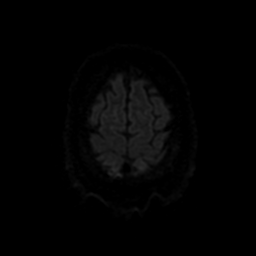
[im 100/100]
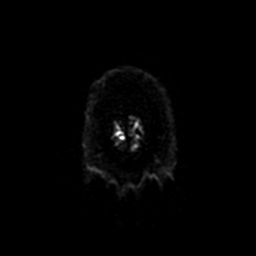

[Series 5: DWI · coronal · 4.0mm · 0.94mm/px · 7 of 71 slices shown (2 of 2)]
[im 1/71]
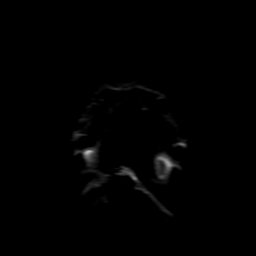
[im 12/71]
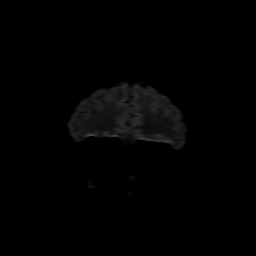
[im 24/71]
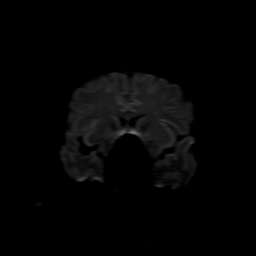
[im 36/71]
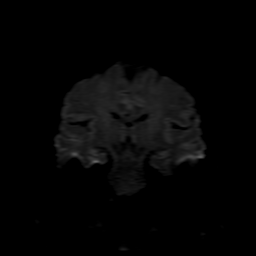
[im 47/71]
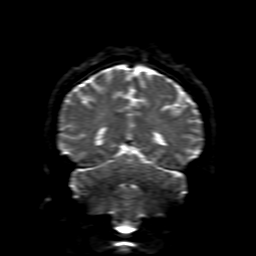
[im 59/71]
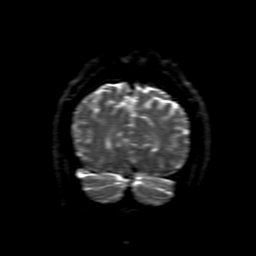
[im 71/71]
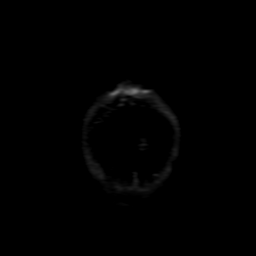

[Series 6: FLAIR · sagittal · 5.0mm · 0.47mm/px · 2 of 25 slices shown (1 of 2)]
[im 1/25]
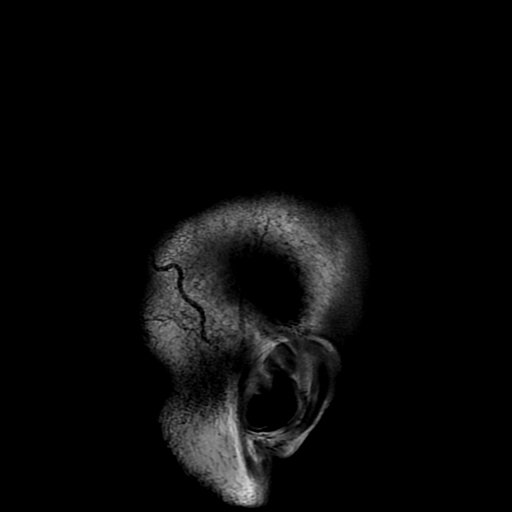
[im 25/25]
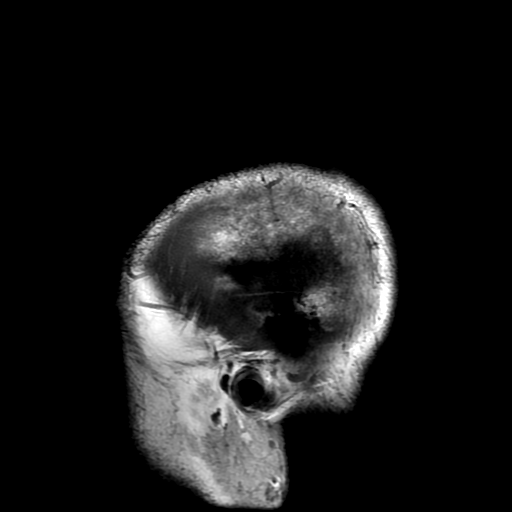

[Series 7: T2 · axial · 5.0mm · 0.43mm/px · z∈[-58,+92]mm · 2 of 27 slices shown (1 of 2)]
[im 1/27]
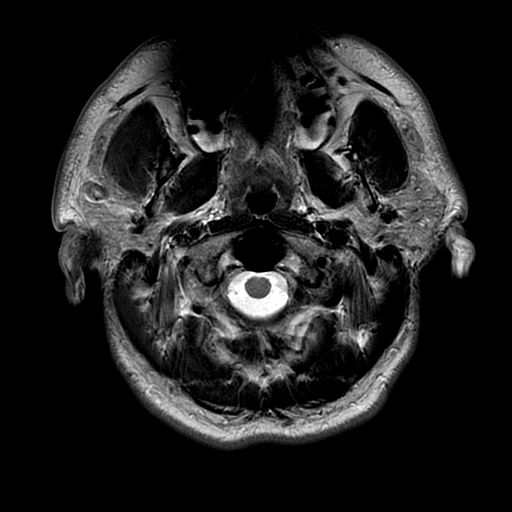
[im 27/27]
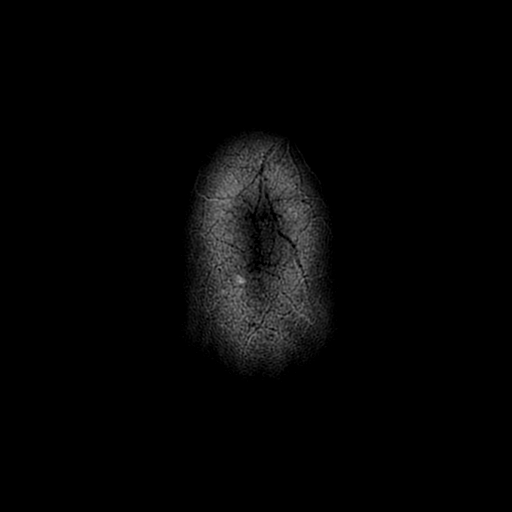

[Series 8: FLAIR · axial · 5.0mm · 0.43mm/px · z∈[-58,+92]mm · 2 of 27 slices shown (2 of 2)]
[im 1/27]
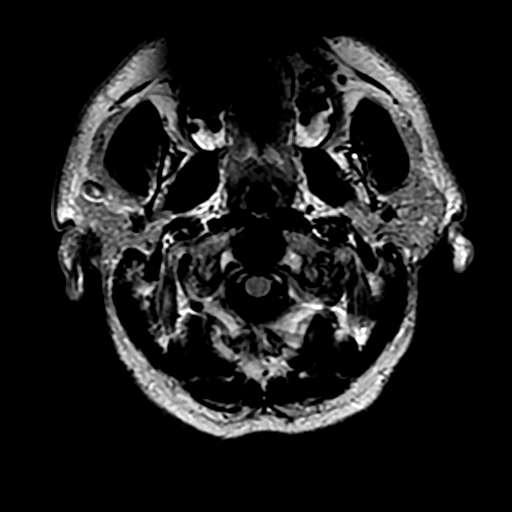
[im 27/27]
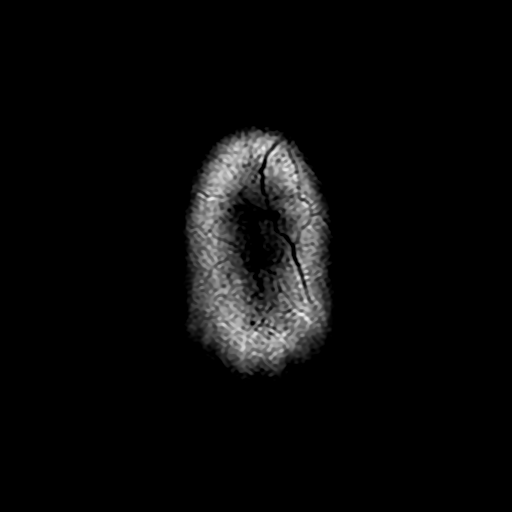

[Series 9: (person_name) · axial · 3.0mm · 0.47mm/px · z∈[-62,+6]mm · 4 of 108 slices shown]
[im 1/108]
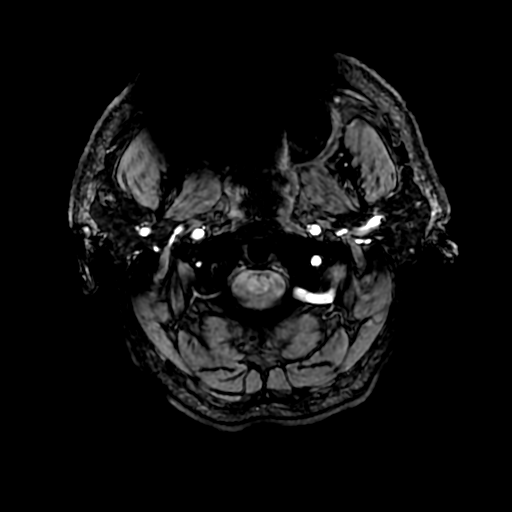
[im 12/108]
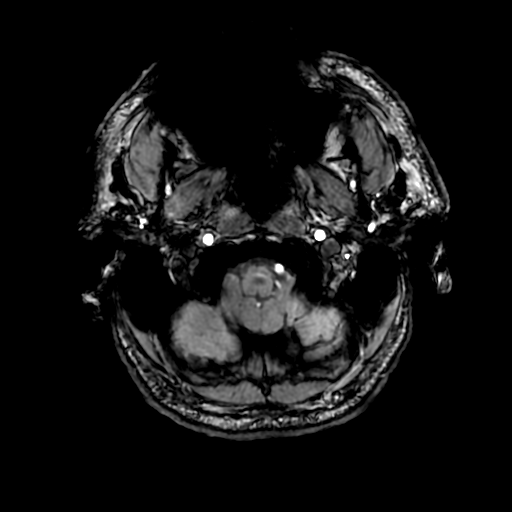
[im 36/108]
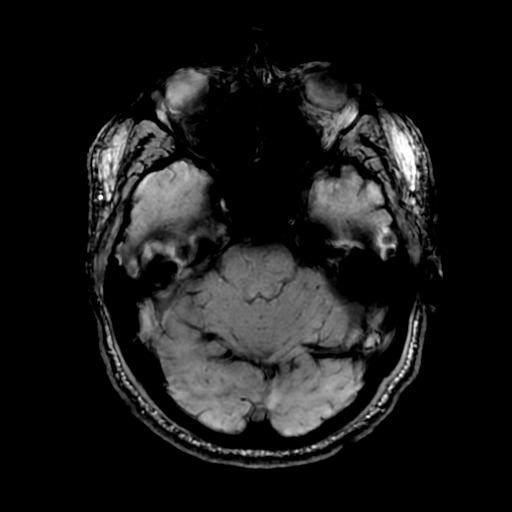
[im 48/108]
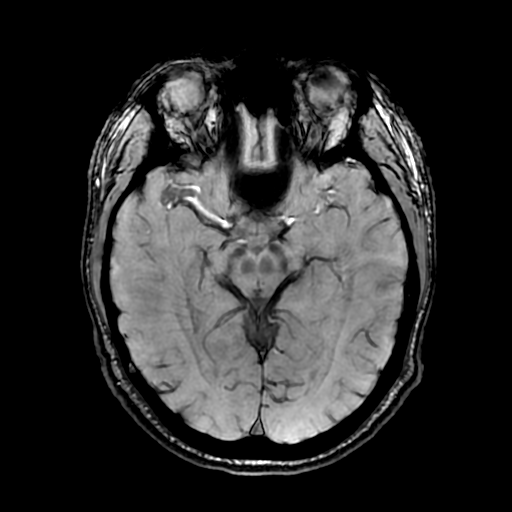

[Series 11: T2 · coronal · 5.0mm · 0.39mm/px · 3 of 30 slices shown (2 of 2)]
[im 1/30]
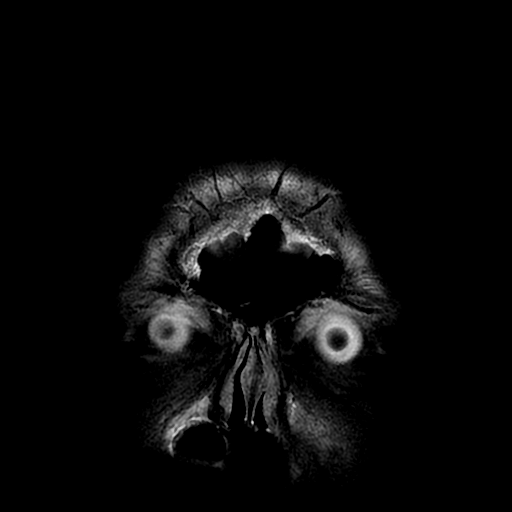
[im 15/30]
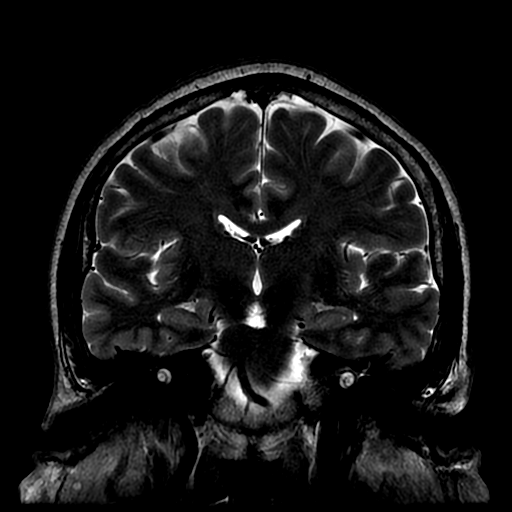
[im 30/30]
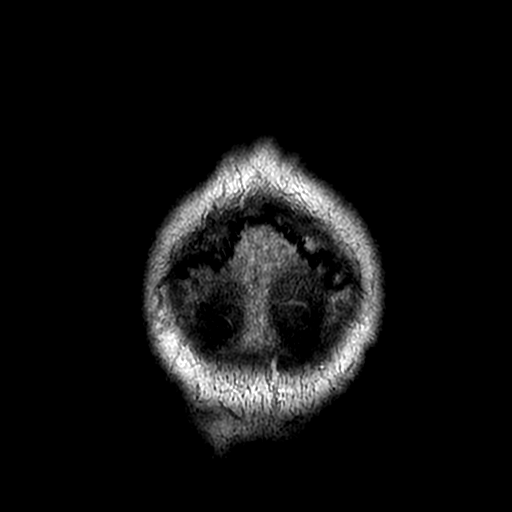

[Series 450: ADC · axial · 3.0mm · 0.94mm/px · z∈[-55,+86]mm · 4 of 49 slices shown (1 of 2)]
[im 1/49]
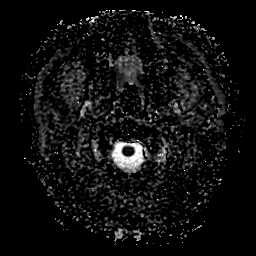
[im 17/49]
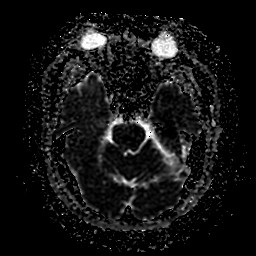
[im 33/49]
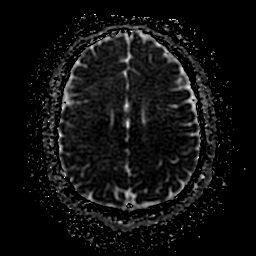
[im 49/49]
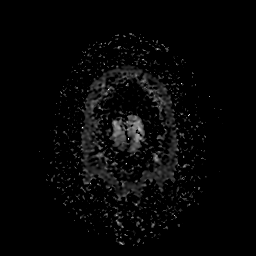

[Series 550: ADC · coronal · 4.0mm · 0.94mm/px · 3 of 36 slices shown (2 of 2)]
[im 1/36]
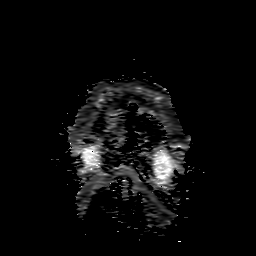
[im 18/36]
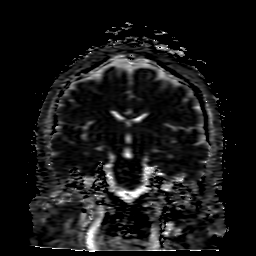
[im 36/36]
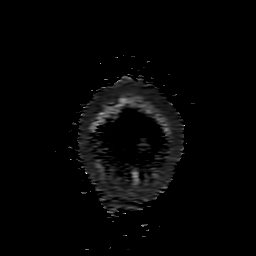

[35 of 48 positions shown; findings below may reference images not displayed]

FINDINGS: Brain: No acute infarction, hemorrhage, hydrocephalus, extra-axial
collection or mass lesion.

Vascular: Normal flow voids.

Skull and upper cervical spine: Normal marrow signal.

Sinuses/Orbits: Negative.

Other: None.
IMPRESSION: No acute intracranial abnormality identified. Unremarkable MRI of
the head.

By: Frantzcy Mews M.D.

## 2019-02-19 DIAGNOSIS — R0789 Other chest pain: Secondary | ICD-10-CM | POA: Diagnosis not present

## 2019-02-19 DIAGNOSIS — E1165 Type 2 diabetes mellitus with hyperglycemia: Secondary | ICD-10-CM | POA: Diagnosis not present

## 2019-02-19 DIAGNOSIS — Z8616 Personal history of COVID-19: Secondary | ICD-10-CM | POA: Diagnosis not present

## 2019-02-19 DIAGNOSIS — R002 Palpitations: Secondary | ICD-10-CM | POA: Diagnosis not present

## 2019-02-25 DIAGNOSIS — Z713 Dietary counseling and surveillance: Secondary | ICD-10-CM | POA: Diagnosis not present

## 2019-03-12 DIAGNOSIS — Z713 Dietary counseling and surveillance: Secondary | ICD-10-CM | POA: Diagnosis not present

## 2019-03-26 DIAGNOSIS — I10 Essential (primary) hypertension: Secondary | ICD-10-CM | POA: Diagnosis not present

## 2019-03-26 DIAGNOSIS — Z8616 Personal history of COVID-19: Secondary | ICD-10-CM | POA: Diagnosis not present

## 2019-03-26 DIAGNOSIS — E1169 Type 2 diabetes mellitus with other specified complication: Secondary | ICD-10-CM | POA: Diagnosis not present

## 2019-03-26 DIAGNOSIS — E291 Testicular hypofunction: Secondary | ICD-10-CM | POA: Diagnosis not present

## 2019-04-09 DIAGNOSIS — E291 Testicular hypofunction: Secondary | ICD-10-CM | POA: Diagnosis not present

## 2019-04-11 DIAGNOSIS — I10 Essential (primary) hypertension: Secondary | ICD-10-CM | POA: Diagnosis not present

## 2019-04-11 DIAGNOSIS — E119 Type 2 diabetes mellitus without complications: Secondary | ICD-10-CM | POA: Diagnosis not present

## 2019-04-11 DIAGNOSIS — M25552 Pain in left hip: Secondary | ICD-10-CM | POA: Diagnosis not present

## 2019-04-11 DIAGNOSIS — M25551 Pain in right hip: Secondary | ICD-10-CM | POA: Diagnosis not present

## 2019-04-18 DIAGNOSIS — K080 Exfoliation of teeth due to systemic causes: Secondary | ICD-10-CM | POA: Diagnosis not present

## 2019-04-23 DIAGNOSIS — E291 Testicular hypofunction: Secondary | ICD-10-CM | POA: Diagnosis not present

## 2019-05-07 DIAGNOSIS — E291 Testicular hypofunction: Secondary | ICD-10-CM | POA: Diagnosis not present

## 2019-05-09 DIAGNOSIS — L578 Other skin changes due to chronic exposure to nonionizing radiation: Secondary | ICD-10-CM | POA: Diagnosis not present

## 2019-05-09 DIAGNOSIS — L570 Actinic keratosis: Secondary | ICD-10-CM | POA: Diagnosis not present

## 2019-05-09 DIAGNOSIS — D225 Melanocytic nevi of trunk: Secondary | ICD-10-CM | POA: Diagnosis not present

## 2019-05-09 DIAGNOSIS — D0439 Carcinoma in situ of skin of other parts of face: Secondary | ICD-10-CM | POA: Diagnosis not present

## 2019-05-09 DIAGNOSIS — B353 Tinea pedis: Secondary | ICD-10-CM | POA: Diagnosis not present

## 2019-05-09 DIAGNOSIS — D045 Carcinoma in situ of skin of trunk: Secondary | ICD-10-CM | POA: Diagnosis not present

## 2019-05-09 DIAGNOSIS — D485 Neoplasm of uncertain behavior of skin: Secondary | ICD-10-CM | POA: Diagnosis not present

## 2019-05-09 DIAGNOSIS — L821 Other seborrheic keratosis: Secondary | ICD-10-CM | POA: Diagnosis not present

## 2019-05-09 DIAGNOSIS — L905 Scar conditions and fibrosis of skin: Secondary | ICD-10-CM | POA: Diagnosis not present

## 2019-05-21 DIAGNOSIS — E291 Testicular hypofunction: Secondary | ICD-10-CM | POA: Diagnosis not present

## 2019-06-04 DIAGNOSIS — E291 Testicular hypofunction: Secondary | ICD-10-CM | POA: Diagnosis not present

## 2019-06-18 DIAGNOSIS — D485 Neoplasm of uncertain behavior of skin: Secondary | ICD-10-CM | POA: Diagnosis not present

## 2019-06-18 DIAGNOSIS — D045 Carcinoma in situ of skin of trunk: Secondary | ICD-10-CM | POA: Diagnosis not present

## 2019-06-18 DIAGNOSIS — E291 Testicular hypofunction: Secondary | ICD-10-CM | POA: Diagnosis not present

## 2019-06-18 DIAGNOSIS — D225 Melanocytic nevi of trunk: Secondary | ICD-10-CM | POA: Diagnosis not present

## 2019-06-20 DIAGNOSIS — D2239 Melanocytic nevi of other parts of face: Secondary | ICD-10-CM | POA: Diagnosis not present

## 2019-06-20 DIAGNOSIS — C44329 Squamous cell carcinoma of skin of other parts of face: Secondary | ICD-10-CM | POA: Diagnosis not present

## 2019-07-01 DIAGNOSIS — I10 Essential (primary) hypertension: Secondary | ICD-10-CM | POA: Diagnosis not present

## 2019-07-01 DIAGNOSIS — E291 Testicular hypofunction: Secondary | ICD-10-CM | POA: Diagnosis not present

## 2019-07-01 DIAGNOSIS — Z0000 Encounter for general adult medical examination without abnormal findings: Secondary | ICD-10-CM | POA: Diagnosis not present

## 2019-07-01 DIAGNOSIS — E1169 Type 2 diabetes mellitus with other specified complication: Secondary | ICD-10-CM | POA: Diagnosis not present

## 2019-07-01 DIAGNOSIS — Z86010 Personal history of colonic polyps: Secondary | ICD-10-CM | POA: Diagnosis not present

## 2019-07-01 DIAGNOSIS — Z23 Encounter for immunization: Secondary | ICD-10-CM | POA: Diagnosis not present

## 2019-07-09 DIAGNOSIS — E291 Testicular hypofunction: Secondary | ICD-10-CM | POA: Diagnosis not present

## 2019-07-09 DIAGNOSIS — E1169 Type 2 diabetes mellitus with other specified complication: Secondary | ICD-10-CM | POA: Diagnosis not present

## 2019-07-09 DIAGNOSIS — Z125 Encounter for screening for malignant neoplasm of prostate: Secondary | ICD-10-CM | POA: Diagnosis not present

## 2020-02-20 DIAGNOSIS — R8290 Unspecified abnormal findings in urine: Secondary | ICD-10-CM | POA: Diagnosis not present

## 2020-02-20 DIAGNOSIS — Z23 Encounter for immunization: Secondary | ICD-10-CM | POA: Diagnosis not present

## 2020-02-20 DIAGNOSIS — Z125 Encounter for screening for malignant neoplasm of prostate: Secondary | ICD-10-CM | POA: Diagnosis not present

## 2020-02-20 DIAGNOSIS — E291 Testicular hypofunction: Secondary | ICD-10-CM | POA: Diagnosis not present

## 2020-02-20 DIAGNOSIS — I10 Essential (primary) hypertension: Secondary | ICD-10-CM | POA: Diagnosis not present

## 2020-02-20 DIAGNOSIS — E1169 Type 2 diabetes mellitus with other specified complication: Secondary | ICD-10-CM | POA: Diagnosis not present

## 2020-02-25 DIAGNOSIS — E291 Testicular hypofunction: Secondary | ICD-10-CM | POA: Diagnosis not present

## 2020-03-10 DIAGNOSIS — E291 Testicular hypofunction: Secondary | ICD-10-CM | POA: Diagnosis not present

## 2020-05-05 ENCOUNTER — Ambulatory Visit: Payer: Federal, State, Local not specified - PPO | Admitting: Podiatry

## 2020-06-18 DIAGNOSIS — Z23 Encounter for immunization: Secondary | ICD-10-CM | POA: Diagnosis not present

## 2020-06-18 DIAGNOSIS — E1169 Type 2 diabetes mellitus with other specified complication: Secondary | ICD-10-CM | POA: Diagnosis not present

## 2020-06-18 DIAGNOSIS — I10 Essential (primary) hypertension: Secondary | ICD-10-CM | POA: Diagnosis not present

## 2020-06-18 DIAGNOSIS — Z0000 Encounter for general adult medical examination without abnormal findings: Secondary | ICD-10-CM | POA: Diagnosis not present

## 2020-06-18 DIAGNOSIS — E291 Testicular hypofunction: Secondary | ICD-10-CM | POA: Diagnosis not present

## 2021-02-10 ENCOUNTER — Emergency Department (HOSPITAL_COMMUNITY)
Admission: EM | Admit: 2021-02-10 | Discharge: 2021-02-10 | Disposition: A | Discharge status: Home / Self Care | Payer: Medicare Other | Attending: Emergency Medicine | Admitting: Emergency Medicine

## 2021-02-10 ENCOUNTER — Other Ambulatory Visit: Payer: Self-pay

## 2021-02-10 ENCOUNTER — Emergency Department (HOSPITAL_COMMUNITY): Payer: Medicare Other

## 2021-02-10 ENCOUNTER — Encounter (HOSPITAL_COMMUNITY): Payer: Self-pay | Admitting: Pediatrics

## 2021-02-10 DIAGNOSIS — R079 Chest pain, unspecified: Secondary | ICD-10-CM

## 2021-02-10 DIAGNOSIS — Z5321 Procedure and treatment not carried out due to patient leaving prior to being seen by health care provider: Secondary | ICD-10-CM | POA: Diagnosis not present

## 2021-02-10 LAB — CBC
HCT: 43.1 % (ref 39.0–52.0)
Hemoglobin: 14.8 g/dL (ref 13.0–17.0)
MCH: 29.5 pg (ref 26.0–34.0)
MCHC: 34.3 g/dL (ref 30.0–36.0)
MCV: 86 fL (ref 80.0–100.0)
Platelets: 147 10*3/uL — ABNORMAL LOW (ref 150–400)
RBC: 5.01 MIL/uL (ref 4.22–5.81)
RDW: 13.6 % (ref 11.5–15.5)
WBC: 6.6 10*3/uL (ref 4.0–10.5)
nRBC: 0 % (ref 0.0–0.2)

## 2021-02-10 LAB — BASIC METABOLIC PANEL
Anion gap: 9 (ref 5–15)
BUN: 19 mg/dL (ref 8–23)
CO2: 25 mmol/L (ref 22–32)
Calcium: 9.7 mg/dL (ref 8.9–10.3)
Chloride: 103 mmol/L (ref 98–111)
Creatinine, Ser: 1.07 mg/dL (ref 0.61–1.24)
GFR, Estimated: >60 mL/min (ref >60)
Glucose, Bld: 139 mg/dL — ABNORMAL HIGH (ref 70–99)
Potassium: 3.9 mmol/L (ref 3.5–5.1)
Sodium: 137 mmol/L (ref 135–145)

## 2021-02-10 LAB — TROPONIN I (HIGH SENSITIVITY)
Troponin I (High Sensitivity): <2 ng/L (ref <18)
Troponin I (High Sensitivity): 3 ng/L (ref <18)

## 2021-02-10 NOTE — ED Provider Triage Note (Signed)
Emergency Medicine Provider Triage Evaluation Note  Patrick Buckley , a 66 y.o. male  was evaluated in triage.  Pt complains of exertional chest pain since Monday. Patient reports he was in his garden doing yardwork when the CP began. Patient reports he continued doing yard work, and when he went inside to rest the CP dissipated. Patient denies history of cardiac issues. States CP radiates to back. Equal pulses throughout.    Review of Systems  Positive: CP Negative: SOB, lightheaded, weak, dizzy, ab pain, n, v  Physical Exam  BP (!) 127/96 (BP Location: Right Arm)    Pulse 73    Temp 98.1 F (36.7 C) (Oral)    Resp 14    Ht 6\' 1"  (1.854 m)    Wt 98.9 kg    SpO2 98%    BMI 28.76 kg/m  Gen:   Awake, no distress   Resp:  Normal effort  MSK:   Moves extremities without difficulty  Other:  No lower extremity swelling  Medical Decision Making  Medically screening exam initiated at 12:14 PM.  Appropriate orders placed.  was informed that the remainder of the evaluation will be completed by another provider, this initial triage assessment does not replace that evaluation, and the importance of remaining in the ED until their evaluation is complete.     Lilli Few, PA-C 02/10/21 1217

## 2021-02-10 NOTE — ED Triage Notes (Signed)
Reported chest pain started Monday while doing some outside work, radiated to the back, and similar episode happened yesterday when he started some work. Stated discomfort is a sensation of "feeling hot". Denies any heart problems.

## 2021-02-10 NOTE — ED Notes (Signed)
Pt stated he was tired of waiting and will just have a heartache at home.

## 2022-07-06 ENCOUNTER — Encounter (HOSPITAL_BASED_OUTPATIENT_CLINIC_OR_DEPARTMENT_OTHER): Payer: Self-pay | Admitting: Urology

## 2022-07-06 ENCOUNTER — Other Ambulatory Visit: Payer: Self-pay | Admitting: Urology

## 2022-07-06 NOTE — Progress Notes (Signed)
Spoke w/ via phone for pre-op interview--- [employer]---- EKG and ISTAT per anesthesia              Lab results------ COVID test -----patient states asymptomatic no test needed Arrive at -------0900 NPO after MN NO Solid Food.  Clear liquids from MN until---0800 Med rec completed Medications to take morning of surgery -----NONE Diabetic medication -----NONE AM of surgery Patient instructed no nail polish to be worn day of surgery Patient instructed to bring photo id and insurance card day of surgery Patient aware to have Driver (ride ) / caregiver  Wife Patrick Buckley  for 24 hours after surgery  Patient Special Instructions ----- Pre-Op special Instructions ----- Patient verbalized understanding of instructions that were given at this phone interview. Patient denies shortness of breath, chest pain, fever, cough at this phone interview.

## 2022-07-19 NOTE — Progress Notes (Signed)
Pt called updated medication record and will take sildenafil am of surgery

## 2022-07-25 NOTE — Anesthesia Preprocedure Evaluation (Signed)
Anesthesia Evaluation  Patient identified by MRN, date of birth, ID band Patient awake    Reviewed: Allergy & Precautions, NPO status , Patient's Chart, lab work & pertinent test results  Airway Mallampati: II  TM Distance: >3 FB Neck ROM: Full    Dental no notable dental hx. (+) Teeth Intact, Dental Advisory Given   Pulmonary neg pulmonary ROS   Pulmonary exam normal breath sounds clear to auscultation       Cardiovascular hypertension, Pt. on medications Normal cardiovascular exam Rhythm:Regular Rate:Normal     Neuro/Psych negative neurological ROS  negative psych ROS   GI/Hepatic negative GI ROS, Neg liver ROS,,,  Endo/Other  diabetes, Type 2, Oral Hypoglycemic Agents    Renal/GU Renal diseaseKidney stones     Musculoskeletal  (+) Arthritis , Osteoarthritis,    Abdominal   Peds  Hematology negative hematology ROS (+)   Anesthesia Other Findings All: Ace, Amlodipine  Reproductive/Obstetrics                             Anesthesia Physical Anesthesia Plan  ASA: 3  Anesthesia Plan: General   Post-op Pain Management: Ofirmev IV (intra-op)* and Precedex   Induction: Intravenous  PONV Risk Score and Plan: 3 and Treatment may vary due to age or medical condition, Midazolam, Ondansetron and Dexamethasone  Airway Management Planned: LMA  Additional Equipment: None  Intra-op Plan:   Post-operative Plan: Extubation in OR  Informed Consent: I have reviewed the patients History and Physical, chart, labs and discussed the procedure including the risks, benefits and alternatives for the proposed anesthesia with the patient or authorized representative who has indicated his/her understanding and acceptance.     Dental advisory given  Plan Discussed with: CRNA  Anesthesia Plan Comments:        Anesthesia Quick Evaluation

## 2022-07-26 ENCOUNTER — Ambulatory Visit (HOSPITAL_BASED_OUTPATIENT_CLINIC_OR_DEPARTMENT_OTHER)
Admission: RE | Admit: 2022-07-26 | Discharge: 2022-07-26 | Disposition: A | Discharge status: Home / Self Care | Payer: Medicare Other | Attending: Urology | Admitting: Urology

## 2022-07-26 ENCOUNTER — Ambulatory Visit (HOSPITAL_BASED_OUTPATIENT_CLINIC_OR_DEPARTMENT_OTHER): Payer: Medicare Other | Admitting: Anesthesiology

## 2022-07-26 ENCOUNTER — Encounter (HOSPITAL_BASED_OUTPATIENT_CLINIC_OR_DEPARTMENT_OTHER)
Admission: RE | Disposition: A | Discharge status: Home / Self Care | Payer: Self-pay | Source: Home / Self Care | Attending: Urology

## 2022-07-26 ENCOUNTER — Encounter (HOSPITAL_BASED_OUTPATIENT_CLINIC_OR_DEPARTMENT_OTHER): Payer: Self-pay | Admitting: Urology

## 2022-07-26 DIAGNOSIS — R3912 Poor urinary stream: Secondary | ICD-10-CM | POA: Insufficient documentation

## 2022-07-26 DIAGNOSIS — N138 Other obstructive and reflux uropathy: Secondary | ICD-10-CM | POA: Insufficient documentation

## 2022-07-26 DIAGNOSIS — I10 Essential (primary) hypertension: Secondary | ICD-10-CM | POA: Diagnosis not present

## 2022-07-26 DIAGNOSIS — N401 Enlarged prostate with lower urinary tract symptoms: Secondary | ICD-10-CM

## 2022-07-26 DIAGNOSIS — E119 Type 2 diabetes mellitus without complications: Secondary | ICD-10-CM | POA: Diagnosis not present

## 2022-07-26 DIAGNOSIS — N35919 Unspecified urethral stricture, male, unspecified site: Secondary | ICD-10-CM | POA: Diagnosis not present

## 2022-07-26 DIAGNOSIS — N320 Bladder-neck obstruction: Secondary | ICD-10-CM | POA: Diagnosis not present

## 2022-07-26 HISTORY — PX: CYSTOSCOPY WITH URETHRAL DILATATION: SHX5125

## 2022-07-26 HISTORY — PX: TRANSURETHRAL RESECTION OF PROSTATE: SHX73

## 2022-07-26 LAB — POCT I-STAT, CHEM 8
BUN: 20 mg/dL (ref 8–23)
Calcium, Ion: 1.32 mmol/L (ref 1.15–1.40)
Chloride: 106 mmol/L (ref 98–111)
Creatinine, Ser: 0.9 mg/dL (ref 0.61–1.24)
Glucose, Bld: 251 mg/dL — ABNORMAL HIGH (ref 70–99)
HCT: 40 % (ref 39.0–52.0)
Hemoglobin: 13.6 g/dL (ref 13.0–17.0)
Potassium: 3.8 mmol/L (ref 3.5–5.1)
Sodium: 140 mmol/L (ref 135–145)
TCO2: 22 mmol/L (ref 22–32)

## 2022-07-26 LAB — GLUCOSE, CAPILLARY: Glucose-Capillary: 204 mg/dL — ABNORMAL HIGH (ref 70–99)

## 2022-07-26 SURGERY — TURP (TRANSURETHRAL RESECTION OF PROSTATE)
Anesthesia: General | Site: Urethra

## 2022-07-26 MED ORDER — CEFAZOLIN SODIUM-DEXTROSE 2-4 GM/100ML-% IV SOLN
2.0000 g | Freq: Once | INTRAVENOUS | Status: AC
  Administered 2022-07-26: 2 g via INTRAVENOUS

## 2022-07-26 MED ORDER — CEFAZOLIN SODIUM-DEXTROSE 2-4 GM/100ML-% IV SOLN
INTRAVENOUS | Status: AC
  Filled 2022-07-26: qty 100

## 2022-07-26 MED ORDER — MIDAZOLAM HCL 5 MG/5ML IJ SOLN
INTRAMUSCULAR | Status: DC | PRN
  Administered 2022-07-26: 2 mg via INTRAVENOUS

## 2022-07-26 MED ORDER — OXYCODONE HCL 5 MG/5ML PO SOLN: 5.0000 mg | Freq: Once | ORAL | Status: AC | PRN

## 2022-07-26 MED ORDER — STERILE WATER FOR IRRIGATION IR SOLN
Status: DC | PRN
  Administered 2022-07-26: 500 mL

## 2022-07-26 MED ORDER — PHENYLEPHRINE 80 MCG/ML (10ML) SYRINGE FOR IV PUSH (FOR BLOOD PRESSURE SUPPORT)
PREFILLED_SYRINGE | INTRAVENOUS | Status: AC
  Filled 2022-07-26: qty 10

## 2022-07-26 MED ORDER — PROPOFOL 10 MG/ML IV BOLUS
INTRAVENOUS | Status: DC | PRN
Start: 2022-07-26 — End: 2022-07-26
  Administered 2022-07-26: 30 mg via INTRAVENOUS
  Administered 2022-07-26: 200 mg via INTRAVENOUS

## 2022-07-26 MED ORDER — OXYCODONE HCL 5 MG PO TABS
ORAL_TABLET | ORAL | Status: AC
  Filled 2022-07-26: qty 1

## 2022-07-26 MED ORDER — IOHEXOL 300 MG/ML  SOLN
INTRAMUSCULAR | Status: DC | PRN
  Administered 2022-07-26: 50 mL

## 2022-07-26 MED ORDER — ACETAMINOPHEN 10 MG/ML IV SOLN: 1000.0000 mg | Freq: Once | INTRAVENOUS | Status: DC | PRN

## 2022-07-26 MED ORDER — LACTATED RINGERS IV SOLN: INTRAVENOUS | Status: DC

## 2022-07-26 MED ORDER — ONDANSETRON HCL 4 MG/2ML IJ SOLN
INTRAMUSCULAR | Status: DC | PRN
  Administered 2022-07-26: 4 mg via INTRAVENOUS

## 2022-07-26 MED ORDER — MIDAZOLAM HCL 2 MG/2ML IJ SOLN
INTRAMUSCULAR | Status: AC
  Filled 2022-07-26: qty 2

## 2022-07-26 MED ORDER — ARTIFICIAL TEARS OPHTHALMIC OINT
TOPICAL_OINTMENT | OPHTHALMIC | Status: AC
  Filled 2022-07-26: qty 3.5

## 2022-07-26 MED ORDER — LIDOCAINE HCL (PF) 2 % IJ SOLN
INTRAMUSCULAR | Status: AC
  Filled 2022-07-26: qty 5

## 2022-07-26 MED ORDER — DEXAMETHASONE SODIUM PHOSPHATE 10 MG/ML IJ SOLN
INTRAMUSCULAR | Status: DC | PRN
  Administered 2022-07-26: 5 mg via INTRAVENOUS

## 2022-07-26 MED ORDER — PHENYLEPHRINE HCL-NACL 20-0.9 MG/250ML-% IV SOLN
INTRAVENOUS | Status: DC | PRN
  Administered 2022-07-26: 40 ug/min via INTRAVENOUS

## 2022-07-26 MED ORDER — AMISULPRIDE (ANTIEMETIC) 5 MG/2ML IV SOLN: 10.0000 mg | Freq: Once | INTRAVENOUS | Status: DC | PRN

## 2022-07-26 MED ORDER — FENTANYL CITRATE (PF) 100 MCG/2ML IJ SOLN
INTRAMUSCULAR | Status: DC | PRN
  Administered 2022-07-26: 25 ug via INTRAVENOUS
  Administered 2022-07-26: 50 ug via INTRAVENOUS
  Administered 2022-07-26: 25 ug via INTRAVENOUS
  Administered 2022-07-26: 50 ug via INTRAVENOUS

## 2022-07-26 MED ORDER — SODIUM CHLORIDE 0.9 % IR SOLN
Status: DC | PRN
  Administered 2022-07-26 (×2): 6000 mL

## 2022-07-26 MED ORDER — PROPOFOL 10 MG/ML IV BOLUS
INTRAVENOUS | Status: AC
  Filled 2022-07-26: qty 20

## 2022-07-26 MED ORDER — FENTANYL CITRATE (PF) 100 MCG/2ML IJ SOLN
INTRAMUSCULAR | Status: AC
  Filled 2022-07-26: qty 2

## 2022-07-26 MED ORDER — PHENYLEPHRINE 80 MCG/ML (10ML) SYRINGE FOR IV PUSH (FOR BLOOD PRESSURE SUPPORT)
PREFILLED_SYRINGE | INTRAVENOUS | Status: DC | PRN
  Administered 2022-07-26 (×3): 240 ug via INTRAVENOUS

## 2022-07-26 MED ORDER — ONDANSETRON HCL 4 MG/2ML IJ SOLN: 4.0000 mg | Freq: Once | INTRAMUSCULAR | Status: DC | PRN

## 2022-07-26 MED ORDER — DEXMEDETOMIDINE HCL IN NACL 80 MCG/20ML IV SOLN
INTRAVENOUS | Status: DC | PRN
  Administered 2022-07-26: 8 ug via INTRAVENOUS

## 2022-07-26 MED ORDER — OXYCODONE HCL 5 MG PO TABS
5.0000 mg | ORAL_TABLET | Freq: Once | ORAL | Status: AC | PRN
  Administered 2022-07-26: 5 mg via ORAL

## 2022-07-26 MED ORDER — HYDROMORPHONE HCL 1 MG/ML IJ SOLN: 0.2500 mg | INTRAMUSCULAR | Status: DC | PRN

## 2022-07-26 MED ORDER — HYDROCODONE-ACETAMINOPHEN 5-325 MG PO TABS: 1.0000 | ORAL_TABLET | ORAL | 0 refills | Status: DC | PRN

## 2022-07-26 MED ORDER — LIDOCAINE 2% (20 MG/ML) 5 ML SYRINGE
INTRAMUSCULAR | Status: DC | PRN
  Administered 2022-07-26: 60 mg via INTRAVENOUS

## 2022-07-26 SURGICAL SUPPLY — 32 items
BAG DRAIN URO-CYSTO SKYTR STRL (DRAIN) ×2 IMPLANT
BAG DRN RND TRDRP ANRFLXCHMBR (UROLOGICAL SUPPLIES) ×2
BAG DRN UROCATH (DRAIN) ×2
BAG URINE DRAIN 2000ML AR STRL (UROLOGICAL SUPPLIES) ×2 IMPLANT
BALLN NEPHROSTOMY (BALLOONS) ×2
BALLN OPTILUME DCB 30X3X75 (BALLOONS)
BALLN OPTILUME DCB 30X5X75 (BALLOONS)
BALLOON NEPHROSTOMY (BALLOONS) IMPLANT
BALLOON OPTILUME DCB 30X3X75 (BALLOONS) IMPLANT
BALLOON OPTILUME DCB 30X5X75 (BALLOONS) IMPLANT
CATH FOLEY 3WAY 30CC 22FR (CATHETERS) ×2 IMPLANT
CATH HEMA 3WAY 30CC 22FR COUDE (CATHETERS) ×2 IMPLANT
CLOTH BEACON ORANGE TIMEOUT ST (SAFETY) ×2 IMPLANT
ELECT REM PT RETURN 9FT ADLT (ELECTROSURGICAL)
ELECTRODE REM PT RTRN 9FT ADLT (ELECTROSURGICAL) ×2 IMPLANT
GLOVE BIO SURGEON STRL SZ 6.5 (GLOVE) ×2 IMPLANT
GOWN STRL REUS W/TWL LRG LVL3 (GOWN DISPOSABLE) ×2 IMPLANT
GUIDEWIRE STR DUAL SENSOR (WIRE) IMPLANT
HOLDER FOLEY CATH W/STRAP (MISCELLANEOUS) ×2 IMPLANT
IV NS IRRIG 3000ML ARTHROMATIC (IV SOLUTION) ×4 IMPLANT
KIT TURNOVER CYSTO (KITS) ×2 IMPLANT
LOOP CUT BIPOLAR 24F LRG (ELECTROSURGICAL) IMPLANT
MANIFOLD NEPTUNE II (INSTRUMENTS) ×2 IMPLANT
NS IRRIG 1000ML POUR BTL (IV SOLUTION) IMPLANT
PACK CYSTO (CUSTOM PROCEDURE TRAY) ×2 IMPLANT
PLUG CATH AND CAP STER (CATHETERS) IMPLANT
SLEEVE SCD COMPRESS KNEE MED (STOCKING) ×2 IMPLANT
SYR TOOMEY IRRIG 70ML (MISCELLANEOUS) ×2
SYRINGE TOOMEY IRRIG 70ML (MISCELLANEOUS) ×2 IMPLANT
TUBE CONNECTING 12X1/4 (SUCTIONS) ×2 IMPLANT
TUBING UROLOGY SET (TUBING) ×2 IMPLANT
WATER STERILE IRR 3000ML UROMA (IV SOLUTION) ×2 IMPLANT

## 2022-07-26 NOTE — Op Note (Signed)
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  Mild trabeculation. Prostatic urethral regrowth in bilateral lobes  Complications: None  EBL: Minimal  Specimens: Prostate chips  Indication: Patrick Buckley is a patient with a history of BPH with bladder outlet obstruction who previously undergone a PVP.  His urinary stream is progressively weakened over the last few years.  In the office cystoscopy showed a severe urethral stricture in the prostatic urethra.  After reviewing the management options for treatment, he elected to proceed with the above surgical procedure(s). We have discussed the potential benefits and risks of the procedure, side effects of the proposed treatment, the likelihood of the patient achieving the goals of the procedure, and any potential problems that might occur during the procedure or recuperation. Informed consent has been obtained.  Description of procedure:  The patient was taken to the operating room and general anesthesia was induced.  The patient was placed in the dorsal lithotomy position, prepped and draped in the usual sterile fashion, and preoperative antibiotics were administered. A preoperative time-out was performed.   Cystourethroscopy was performed.  Again a very narrow stricture was seen in the prostatic urethra.  A 0.38 sensor wire was advanced through this stricture and into the  bladder with fluoroscopic guidance.  Next the Ultrex balloon dilator was placed over the wire so that the radiopaque marker spanned the strictured area.  The balloon dilator was inflated and held in place for 3 minutes.  It was then deflated and removed.  Cystoscopy was performed and was able to traverse along the wire into the bladder.  The bladder neck was noted to be very tight.  There was prostatic urethral regrowth.  Further inspection of the bladder revealed normal bladder mucosa without masses or irregularities.  There was a 5 mm bladder stone that was easily removed through the cystoscope.    The ureteral orifices were identified and marked so as to be avoided during the procedure. The residual/regrowth prostate adenoma was then resected utilizing loop cautery resection with the bipolar cutting loop.  The prostate adenoma from the bladder neck back to the verumontanum was resected beginning at the six o'clock position and then extended to include the right and left lobes of the prostate and anterior prostate. Care was taken not to resect distal to the verumontanum.   Hemostasis was then achieved with the cautery and the bladder was emptied and reinspected with no significant bleeding noted at the end of the procedure.    A 22 3-way catheter was then placed into the bladder.  The inflow port was plugged and the outflow port connected to drainage bag.  The patient appeared to tolerate the procedure well and without complications.  The patient was able to be awakened and transferred to the recovery unit in satisfactory condition.    Plan: discharge home with foley. Void trial in 2 days.

## 2022-07-26 NOTE — Anesthesia Postprocedure Evaluation (Signed)
Anesthesia Post Note  Patient: Patrick Buckley  Procedure(s) Performed: TRANSURETHRAL RESECTION OF THE PROSTATE (TURP) (Prostate) CYSTOSCOPY WITH URETHRAL DILATATION (Urethra)     Patient location during evaluation: PACU Anesthesia Type: General Level of consciousness: awake and alert Pain management: pain level controlled Vital Signs Assessment: post-procedure vital signs reviewed and stable Respiratory status: spontaneous breathing, nonlabored ventilation, respiratory function stable and patient connected to nasal cannula oxygen Cardiovascular status: blood pressure returned to baseline and stable Postop Assessment: no apparent nausea or vomiting Anesthetic complications: no   No notable events documented.  Last Vitals:  Vitals:   07/26/22 1215 07/26/22 1230  BP: 119/80 133/81  Pulse: 86 84  Resp: 16 (!) 24  Temp:  36.4 C  SpO2: 95% 94%    Last Pain:  Vitals:   07/26/22 1230  TempSrc:   PainSc: 0-No pain                 Trevor Iha

## 2022-07-26 NOTE — Anesthesia Procedure Notes (Signed)
Procedure Name: LMA Insertion Date/Time: 07/26/2022 10:22 AM  Performed by: Bishop Limbo, CRNAPre-anesthesia Checklist: Patient identified, Emergency Drugs available, Suction available and Patient being monitored Patient Re-evaluated:Patient Re-evaluated prior to induction Oxygen Delivery Method: Circle System Utilized Preoxygenation: Pre-oxygenation with 100% oxygen Induction Type: IV induction Ventilation: Mask ventilation without difficulty LMA: LMA inserted LMA Size: 5.0 Number of attempts: 1 Airway Equipment and Method: Bite block Placement Confirmation: positive ETCO2 Tube secured with: Tape Dental Injury: Teeth and Oropharynx as per pre-operative assessment

## 2022-07-26 NOTE — Transfer of Care (Signed)
Immediate Anesthesia Transfer of Care Note  Patient: Patrick Buckley  Procedure(s) Performed: TRANSURETHRAL RESECTION OF THE PROSTATE (TURP) (Prostate) CYSTOSCOPY WITH URETHRAL DILATATION (Urethra)  Patient Location: PACU  Anesthesia Type:General  Level of Consciousness: drowsy and patient cooperative  Airway & Oxygen Therapy: Patient Spontanous Breathing and Patient connected to nasal cannula oxygen  Post-op Assessment: Report given to RN and Post -op Vital signs reviewed and stable  Post vital signs: Reviewed and stable  Last Vitals:  Vitals Value Taken Time  BP 108/68 07/26/22 1128  Temp 36.6 C 07/26/22 1128  Pulse 80 07/26/22 1130  Resp 12 07/26/22 1130  SpO2 93 % 07/26/22 1130  Vitals shown include unfiled device data.  Last Pain:  Vitals:   07/26/22 0928  TempSrc: Oral  PainSc: 6       Patients Stated Pain Goal: 5 (07/26/22 0981)  Complications: No notable events documented.

## 2022-07-26 NOTE — Discharge Instructions (Addendum)
Post transurethral resection of the prostate (TURP) instructions and Urethral dilation  Your recent prostate surgery requires very special post hospital care. Despite the fact that no skin incisions were used the area around the prostate incision is quite raw and is covered with a scab to promote healing and prevent bleeding. Certain cautions are needed to assure that the scab is not disturbed of the next 2-3 weeks while the healing proceeds.  Because the raw surface in your prostate and the irritating effects of urine you may expect frequency of urination and/or urgency (a stronger desire to urinate) and perhaps even getting up at night more often. This will usually resolve or improve slowly over the healing period. You may see some blood in your urine over the first 6 weeks. Do not be alarmed, even if the urine was clear for a while. Get off your feet and drink lots of fluids until clearing occurs. If you start to pass clots or don't improve call us.  Catheter: (If you are discharged with a catheter.) 1. Keep your catheter secured to your leg at all times with tape or the supplied strap. 2. You may experience leakage of urine around your catheter- as long as the  catheter continues to drain, this is normal.  If your catheter stops draining  go to the ER. 3. You may also have blood in your urine, even after it has been clear for  several days; you may even pass some small blood clots or other material.  This  is normal as well.  If this happens, sit down and drink plenty of water to help  make urine to flush out your bladder.  If the blood in your urine becomes worse  after doing this, contact our office or return to the ER. 4. You may use the leg bag (small bag) during the day, but use the large bag at  night.  Diet:  You may return to your normal diet immediately. Because of the raw surface of your bladder, alcohol, spicy foods, foods high in acid and drinks with caffeine may cause irritation  or frequency and should be used in moderation. To keep your urine flowing freely and avoid constipation, drink plenty of fluids during the day (8-10 glasses). Tip: Avoid cranberry juice because it is very acidic.  Activity:  Your physical activity doesn't need to be restricted. However, if you are very active, you may see some blood in the urine. We suggest that you reduce your activity under the circumstances until the bleeding has stopped.  Bowels:  It is important to keep your bowels regular during the postoperative period. Straining with bowel movements can cause bleeding. A bowel movement every other day is reasonable. Use a mild laxative if needed, such as milk of magnesia 2-3 tablespoons, or 2 Dulcolax tablets. Call if you continue to have problems. If you had been taking narcotics for pain, before, during or after your surgery, you may be constipated. Take a laxative if necessary.  Medication:  You should resume your pre-surgery medications unless told not to. DO NOT RESUME YOUR ASPIRIN, WARFARIN, OR OTHER BLOOD THINNER FOR 1 WEEK. In addition you may be given an antibiotic to prevent or treat infection. Antibiotics are not always necessary. All medication should be taken as prescribed until the bottles are finished unless you are having an unusual reaction to one of the drugs.     Problems you should report to [place]:  a. Fever greater than 101F. b. Heavy bleeding,  or clots (see notes above about blood in urine). c. Inability to urinate. d. Drug reactions (hives, rash, nausea, vomiting, diarrhea). e. Severe burning or pain with urination that is not improving.    Post Anesthesia Home Care Instructions  Activity: Get plenty of rest for the remainder of the day. A responsible individual must stay with you for 24 hours following the procedure.  For the next 24 hours, DO NOT: -Drive a car -Advertising copywriter -Drink alcoholic beverages -Take any medication unless instructed by your  physician -Make any legal decisions or sign important papers.  Meals: Start with liquid foods such as gelatin or soup. Progress to regular foods as tolerated. Avoid greasy, spicy, heavy foods. If nausea and/or vomiting occur, drink only clear liquids until the nausea and/or vomiting subsides. Call your physician if vomiting continues.  Special Instructions/Symptoms: Your throat may feel dry or sore from the anesthesia or the breathing tube placed in your throat during surgery. If this causes discomfort, gargle with warm salt water. The discomfort should disappear within 24 hours.

## 2022-07-26 NOTE — Interval H&P Note (Signed)
History and Physical Interval Note:  07/26/2022 10:14 AM  Patrick Buckley  has presented today for surgery, with the diagnosis of BENIGN PROSTATIC HYPERTROPHY, BLADDER NECK CONTRACTURE.  The various methods of treatment have been discussed with the patient and family. After consideration of risks, benefits and other options for treatment, the patient has consented to  Procedure(s): TRANSURETHRAL RESECTION OF THE PROSTATE (TURP) (N/A) CYSTOSCOPY WITH URETHRAL DILATATION (N/A) as a surgical intervention.  The patient's history has been reviewed, patient examined, no change in status, stable for surgery.  I have reviewed the patient's chart and labs.  Questions were answered to the patient's satisfaction.     Lenia Housley D Jennamarie Goings

## 2022-07-26 NOTE — H&P (Signed)
CC/HPI: cc: Urinary frequency/urgency   07/01/2022: 67 year old man with a history of BPH with LUTS who underwent PVP in 2016 also with a history of an elevated PSA. He was last seen in 2019 by Dr. Mena Goes. Over the last 2 to 3 months patient's been treated with 3 rounds of antibiotics for UTI and has had a difficult time emptying his bladder with a weak urinary stream. He feels like symptoms are progressively getting worse. He did try tamsulosin for short period but is unsure if it helped him. Most recent PSA is 3.1 on 06/14/2022. PVR greater than 300 cc today.   IPSS: 2, 0, 2, 2, 3, 2, 1= 12/35  Mostly dissatisfied quality of life 4/6   PSA Hx:  May 2016 PSA 2.39  Apr 2018 PSA 2.99  Mar 2019 PSA 5.37 --> recheck 4.9 with 13% free.   07/05/2022: 67 year old man with a history of BPH with LUTS who underwent a PVP in 2016 now with worsening force of urinary stream.     ALLERGIES: No Known Drug Allergies    MEDICATIONS: Metformin Hcl 500 mg tablet  Tamsulosin Hcl 0.4 mg capsule 1 capsule PO Q HS  Glimepiride 2 mg tablet  Sildenafil Citrate 20 mg tablet  Valsartan-Hydrochlorothiazide 320 mg-25 mg tablet     GU PSH: Laser Surgery Prostate - 2016       PSH Notes: Laser Vaporization With Transurethral Resection Of Prostate, No Surgical Problems   NON-GU PSH: No Non-GU PSH    GU PMH: BPH w/LUTS - 07/01/2022, (Stable), - 2019, BPH (benign prostatic hypertrophy) with urinary obstruction, - 2016 Incomplete bladder emptying - 07/01/2022 Weak Urinary Stream - 07/01/2022 Elevated PSA - 2019 Urinary Urgency (Stable) - 2019, Urinary urgency, - 2016 Low back pain, Lower back pain - 2016 Renal cyst, Renal cysts, acquired, bilateral - 2016, Complex renal cyst, - 2016 Renal calculus, Nephrolithiasis - 2016 Urinary Tract Inf, Unspec site, Bacteriuria with pyuria - 2016, UTI (urinary tract infection), bacterial, - 2016 Urinary Retention, Unspec, Incomplete bladder emptying - 2016 History of  urolithiasis, History of renal calculi - 2015    NON-GU PMH: Encounter for general adult medical examination without abnormal findings, Encounter for preventive health examination - 2016 Glycosuria, Glucosuria - 2016 Personal history of other diseases of the circulatory system, History of hypertension - 2015 Personal history of other endocrine, nutritional and metabolic disease, History of diabetes mellitus - 2015 Diabetes Type 2 Hypertension    FAMILY HISTORY: 1 Daughter - Daughter Hypertension - Runs In Family malignant neoplasm - Runs In Family   SOCIAL HISTORY: Marital Status: Married Preferred Language: English; Race: White Current Smoking Status: Patient has never smoked.   Tobacco Use Assessment Completed: Used Tobacco in last 30 days? Does drink.  Drinks 3 caffeinated drinks per day. Patient's occupation is/was Retired.     Notes: Never a smoker, No caffeine use, No alcohol use, Married, One child   VITAL SIGNS: None   GU PHYSICAL EXAMINATION:    Urethral Meatus: Normal size. No lesion, no wart, no discharge, no polyp. Normal location.   MULTI-SYSTEM PHYSICAL EXAMINATION:    Constitutional: Well-nourished. No physical deformities. Normally developed. Good grooming.  Neck: Neck symmetrical, not swollen. Normal tracheal position.  Respiratory: No labored breathing, no use of accessory muscles.   Skin: No paleness, no jaundice, no cyanosis. No lesion, no ulcer, no rash.  Neurologic / Psychiatric: Oriented to time, oriented to place, oriented to person. No depression, no anxiety, no agitation.  Eyes: Normal conjunctivae. Normal  eyelids.  Ears, Nose, Mouth, and Throat: Left ear no scars, no lesions, no masses. Right ear no scars, no lesions, no masses. Nose no scars, no lesions, no masses. Normal hearing. Normal lips.  Musculoskeletal: Normal gait and station of head and neck.     Complexity of Data:  Records Review:   Previous Patient Records, POC Tool  Urine Test  Review:   Urinalysis   05/10/17 05/08/14  PSA  Total PSA 2.19 ng/mL 2.39     PROCEDURES:         Flexible Cystoscopy - 52000  Risks, benefits, and some of the potential complications of the procedure were discussed at length with the patient including infection, bleeding, voiding discomfort, urinary retention, fever, chills, sepsis, and others. All questions were answered. Informed consent was obtained. Sterile technique and intraurethral analgesia were used.  Meatus:  Normal size. Normal location. Normal condition.  Urethra:  No strictures.  External Sphincter:  Normal.  Verumontanum:  Normal.  Prostate:  Obstructing lateral lobes  Bladder Neck:  Moderate bladder neck contracture. Unable to traverse cystoscope through bladder neck contracture.      The lower urinary tract was carefully examined up until the bladder neck at which point the contracture limited passage of scope. The procedure was well-tolerated and without complications. Antibiotic instructions were given. Instructions were given to call the office immediately for bloody urine, difficulty urinating, urinary retention, painful or frequent urination, fever, chills, nausea, vomiting or other illness. The patient stated that he understood these instructions and would comply with them.         Urinalysis w/Scope Dipstick Dipstick Cont'd Micro  Color: Yellow Bilirubin: Neg mg/dL WBC/hpf: 20 - 82/NFA  Appearance: Slightly Cloudy Ketones: Neg mg/dL RBC/hpf: NS (Not Seen)  Specific Gravity: 1.020 Blood: Neg ery/uL Bacteria: Rare (0-9/hpf)  pH: <=5.0 Protein: Neg mg/dL Cystals: NS (Not Seen)  Glucose: 2+ mg/dL Urobilinogen: 0.2 mg/dL Casts: NS (Not Seen)    Nitrites: Neg Trichomonas: Not Present    Leukocyte Esterase: 3+ leu/uL Mucous: Present      Epithelial Cells: NS (Not Seen)      Yeast: NS (Not Seen)      Sperm: Not Present    Notes: qns to spin    ASSESSMENT:      ICD-10 Details  1 GU:   BPH w/LUTS - N40.1 Chronic,  Worsening  2   Incomplete bladder emptying - R39.14 Chronic, Worsening  3   Weak Urinary Stream - R39.12 Chronic, Worsening  4   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 Undiagnosed New Problem   PLAN:           Document Letter(s):  Created for Patient: Clinical Summary         Notes:   Bladder neck contracture/BPH with LUTS:  -Patient with evidence of lateral lobe regrowth following PVP as well as bladder neck contracture which would not allow the scope to traverse into the bladder  -I discussed with patient moving forward with balloon dilation of bladder neck followed by TURP  -Risks and benefits of the procedure discussed with the patient in detail including but not limited to pain, bleeding, infection, retrograde ejaculation, need for Foley catheter, prostate regrowth, need for additional procedures, damage to surrounding structures, worsening urinary urgency

## 2022-07-27 ENCOUNTER — Encounter (HOSPITAL_BASED_OUTPATIENT_CLINIC_OR_DEPARTMENT_OTHER): Payer: Self-pay | Admitting: Urology

## 2022-07-27 LAB — SURGICAL PATHOLOGY

## 2022-10-13 ENCOUNTER — Other Ambulatory Visit (HOSPITAL_COMMUNITY): Payer: Self-pay | Admitting: Internal Medicine

## 2022-10-13 DIAGNOSIS — R519 Headache, unspecified: Secondary | ICD-10-CM

## 2022-10-14 ENCOUNTER — Ambulatory Visit (HOSPITAL_COMMUNITY)
Admission: RE | Admit: 2022-10-14 | Discharge: 2022-10-14 | Disposition: A | Discharge status: Home / Self Care | Payer: Medicare Other | Source: Ambulatory Visit | Attending: Internal Medicine | Admitting: Internal Medicine

## 2022-10-14 DIAGNOSIS — R519 Headache, unspecified: Secondary | ICD-10-CM | POA: Insufficient documentation

## 2022-10-26 ENCOUNTER — Encounter (INDEPENDENT_AMBULATORY_CARE_PROVIDER_SITE_OTHER): Payer: Self-pay | Admitting: Otolaryngology

## 2022-11-07 ENCOUNTER — Other Ambulatory Visit: Payer: Self-pay | Admitting: Urology

## 2022-11-07 NOTE — Progress Notes (Addendum)
PCP - Hillard Danker , MD LOV 10-21-22 epic Cardiologist - no  PPM/ICD -  Device Orders -  Rep Notified -   Chest x-ray -  EKG - 07-26-22 epic Stress Test -  ECHO -  Cardiac Cath -  CBC/diff, CMP, HgbA1c 10-21-22 epic  Sleep Study -  CPAP -   Fasting Blood Sugar - 150 Checks Blood Sugar _sometimes____   Blood Thinner Instructions: Aspirin Instructions:  ERAS Protcol - PRE-SURGERY Ensure or G2-    COVID vaccine -yes  Activity-- Anesthesia review: HTN, DMII,   Patient denies shortness of breath, fever, cough and chest pain at PAT appointment   All instructions explained to the patient, with a verbal understanding of the material. Patient agrees to go over the instructions while at home for a better understanding. Patient also instructed to self quarantine after being tested for COVID-19. The opportunity to ask questions was provided.

## 2022-11-07 NOTE — Patient Instructions (Signed)
SURGICAL WAITING ROOM VISITATION  Patients having surgery or a procedure may have no more than 2 support people in the waiting area - these visitors may rotate.    Children under the age of 19 must have an adult with them who is not the patient.  Due to an increase in RSV and influenza rates and associated hospitalizations, children ages 41 and under may not visit patients in [facility] hospitals.  If the patient needs to stay at the hospital during part of their recovery, the visitor guidelines for inpatient rooms apply. Pre-op nurse will coordinate an appropriate time for 1 support person to accompany patient in pre-op.  This support person may not rotate.    Please refer to the [facility] website for the visitor guidelines for Inpatients (after your surgery is over and you are in a regular room).       Your procedure is scheduled on: 11-15-22   Report to [facility] Main Entrance    Report to admitting at       0515  AM   Call this number if you have problems the morning of surgery [phone]   Do not eat food    or drink liquids :After Midnight.               If you have questions, please contact your surgeon's office.   FOLLOW ANY ADDITIONAL PRE OP INSTRUCTIONS YOU RECEIVED FROM YOUR SURGEON'S OFFICE!!!     Oral Hygiene is also important to reduce your risk of infection.                                    Remember - BRUSH YOUR TEETH THE MORNING OF SURGERY WITH YOUR REGULAR TOOTHPASTE  DENTURES WILL BE REMOVED PRIOR TO SURGERY PLEASE DO NOT APPLY "Poly grip" OR ADHESIVES!!!   Do NOT smoke after Midnight   Stop all vitamins and herbal supplements 7 days before surgery.   Take these medicines the morning of surgery with A SIP OF WATER:   DO NOT TAKE ANY ORAL DIABETIC MEDICATIONS DAY OF YOUR SURGERY  Bring CPAP mask and tubing day of surgery.                              You may not have any metal on your body including hair pins, jewelry, and body  piercing             Do not wear  lotions, powders, perfumes/cologne, or deodorant              Men may shave face and neck.   Do not bring valuables to the hospital. [facility] IS NOT             RESPONSIBLE   FOR VALUABLES.   Contacts, glasses, dentures or bridgework may not be worn into surgery.   Bring small overnight bag day of surgery.   DO NOT BRING YOUR HOME MEDICATIONS TO THE HOSPITAL. PHARMACY WILL DISPENSE MEDICATIONS LISTED ON YOUR MEDICATION LIST TO YOU DURING YOUR ADMISSION IN THE HOSPITAL!    Patients discharged on the day of surgery will not be allowed to drive home.  Someone NEEDS to stay with you for the first 24 hours after anesthesia.   Special Instructions: Bring a copy of your healthcare power of attorney and living will documents the day of surgery  if you haven't scanned them before.              Please read over the following fact sheets you were given: IF YOU HAVE QUESTIONS ABOUT YOUR PRE-OP INSTRUCTIONS PLEASE CALL [phone]    If you test positive for Covid or have been in contact with anyone that has tested positive in the last 10 days please notify you surgeon.    [facility] - Preparing for Surgery Before surgery, you can play an important role.  Because skin is not sterile, your skin needs to be as free of germs as possible.  You can reduce the number of germs on your skin by washing with CHG (chlorahexidine gluconate) soap before surgery.  CHG is an antiseptic cleaner which kills germs and bonds with the skin to continue killing germs even after washing. Please DO NOT use if you have an allergy to CHG or antibacterial soaps.  If your skin becomes reddened/irritated stop using the CHG and inform your nurse when you arrive at Short Stay. Do not shave (including legs and underarms) for at least 48 hours prior to the first CHG shower.  You may shave your face/neck. Please follow these instructions carefully:  1.  Shower with CHG Soap the night before  surgery and the  morning of Surgery.  2.  If you choose to wash your hair, wash your hair first as usual with your  normal  shampoo.  3.  After you shampoo, rinse your hair and body thoroughly to remove the  shampoo.                           4.  Use CHG as you would any other liquid soap.  You can apply chg directly  to the skin and wash                       Gently with a scrungie or clean washcloth.  5.  Apply the CHG Soap to your body ONLY FROM THE NECK DOWN.   Do not use on face/ open                           Wound or open sores. Avoid contact with eyes, ears mouth and genitals (private parts).                       Wash face,  Genitals (private parts) with your normal soap.             6.  Wash thoroughly, paying special attention to the area where your surgery  will be performed.  7.  Thoroughly rinse your body with warm water from the neck down.  8.  DO NOT shower/wash with your normal soap after using and rinsing off  the CHG Soap.                9.  Pat yourself dry with a clean towel.            10.  Wear clean pajamas.            11.  Place clean sheets on your bed the night of your first shower and do not  sleep with pets. Day of Surgery : Do not apply any lotions/deodorants the morning of surgery.  Please wear clean clothes to the hospital/surgery center.  FAILURE TO FOLLOW THESE INSTRUCTIONS MAY  RESULT IN THE CANCELLATION OF YOUR SURGERY PATIENT SIGNATURE_________________________________  NURSE SIGNATURE__________________________________  ________________________________________________________________________

## 2022-11-09 ENCOUNTER — Ambulatory Visit (INDEPENDENT_AMBULATORY_CARE_PROVIDER_SITE_OTHER): Payer: Medicare Other | Admitting: Audiology

## 2022-11-09 ENCOUNTER — Ambulatory Visit (INDEPENDENT_AMBULATORY_CARE_PROVIDER_SITE_OTHER): Payer: Medicare Other | Admitting: Otolaryngology

## 2022-11-09 ENCOUNTER — Encounter (INDEPENDENT_AMBULATORY_CARE_PROVIDER_SITE_OTHER): Payer: Self-pay

## 2022-11-09 VITALS — Ht 73.0 in | Wt 215.0 lb

## 2022-11-09 DIAGNOSIS — H90A22 Sensorineural hearing loss, unilateral, left ear, with restricted hearing on the contralateral side: Secondary | ICD-10-CM | POA: Diagnosis not present

## 2022-11-09 DIAGNOSIS — H6122 Impacted cerumen, left ear: Secondary | ICD-10-CM | POA: Diagnosis not present

## 2022-11-09 DIAGNOSIS — H9042 Sensorineural hearing loss, unilateral, left ear, with unrestricted hearing on the contralateral side: Secondary | ICD-10-CM | POA: Diagnosis not present

## 2022-11-09 NOTE — Progress Notes (Signed)
  [address] [place], [place] [address] [phone]  Audiological Evaluation    Name: Patrick Buckley     DOB:   15-Apr-1955      MRN:   [id number]                                                                                     Service Date: 11/09/2022        Patient was referred today for a hearing evaluation by Dr. Karle Barr.   Symptoms Yes Details  Hearing loss  []  Patient denied perceiving any hearing loss.  Tinnitus  []  Patient denied experiencing tinnitus.  Balance problems  []  Patient denied vertigo/imbalance sensations.  Previous ear surgeries  []  Patient denied any previous ear surgeries.  Amplification  []  Patient denied the use of hearing aids.    Tympanogram: Right ear: Normal external ear canal volume with normal middle ear pressure and tympanic membrane compliance (Type A). Left ear: Normal external ear canal volume with normal middle ear pressure and tympanic membrane compliance (Type A).    Hearing Evaluation: The audiogram was completed using conventional audiometric techniques under headphones with good reliability.   The hearing test results indicate: Right ear: Normal hearing sensitivity from [phone] Hz sloping to mild hearing loss at 8000 Hz. Left ear: Normal hearing sensitivity from [phone] Hz sloping to moderately-severe sensorineural hearing loss from 2000-8000 Hz. Asymmetry noted from 2000-8000 Hz, where the left ear is worse than the right ear.  Speech Audiometry: Right ear- Speech Reception Threshold (SRT) was obtained at 20 dBHL. Left ear- Speech Reception Threshold (SRT) was obtained at 20 dBHL masked.   Word Recognition Score Tested using NU-6 (MLV) Right ear: 100% was obtained at a presentation level of 60 dBHL which is deemed as excellent understanding. Left ear: 80% was obtained at a presentation level of 60 dBHL with contralateral masking which is deemed as good understanding.    Recommendations: Repeat audiogram  when changes are perceived or per MD. Patient is a candidate for hearing amplification, consider a hearing aid evaluation.   Conley Rolls Aiyden Lauderback, AUD, CCC-A 11/09/22

## 2022-11-10 ENCOUNTER — Other Ambulatory Visit: Payer: Self-pay

## 2022-11-10 ENCOUNTER — Encounter (HOSPITAL_COMMUNITY): Payer: Self-pay

## 2022-11-10 ENCOUNTER — Encounter (HOSPITAL_COMMUNITY)
Admission: RE | Admit: 2022-11-10 | Discharge: 2022-11-10 | Disposition: A | Discharge status: Home / Self Care | Payer: Medicare Other | Source: Ambulatory Visit | Attending: Urology | Admitting: Urology

## 2022-11-10 VITALS — BP 136/94 | HR 75 | Temp 98.3°F | Resp 16 | Ht 73.0 in | Wt 213.0 lb

## 2022-11-10 DIAGNOSIS — Z01812 Encounter for preprocedural laboratory examination: Secondary | ICD-10-CM | POA: Insufficient documentation

## 2022-11-10 DIAGNOSIS — E119 Type 2 diabetes mellitus without complications: Secondary | ICD-10-CM

## 2022-11-10 HISTORY — DX: Myoneural disorder, unspecified: G70.9

## 2022-11-10 LAB — GLUCOSE, CAPILLARY: Glucose-Capillary: 219 mg/dL — ABNORMAL HIGH (ref 70–99)

## 2022-11-11 NOTE — H&P (Signed)
CC/HPI: cc: Urinary frequency/urgency   07/01/2022: 67 year old man with a history of BPH with LUTS who underwent PVP in 2016 also with a history of an elevated PSA. He was last seen in 2019 by Dr. Mena Goes. Over the last 2 to 3 months patient's been treated with 3 rounds of antibiotics for UTI and has had a difficult time emptying his bladder with a weak urinary stream. He feels like symptoms are progressively getting worse. He did try tamsulosin for short period but is unsure if it helped him. Most recent PSA is 3.1 on 06/14/2022. PVR greater than 300 cc today.   IPSS: 2, 0, 2, 2, 3, 2, 1= 12/35  Mostly dissatisfied quality of life 4/6   PSA Hx:  May 2016 PSA 2.39  Apr 2018 PSA 2.99  Mar 2019 PSA 5.37 --> recheck 4.9 with 13% free.   07/05/2022: 67 year old man with a history of BPH with LUTS who underwent a PVP in 2016 now with worsening force of urinary stream.   07/28/2022: 67 year old man who underwent a TURP, cystolitholopaxy, and urethral dilation 2 days ago presents today for voiding trial. Postoperatively he has been doing well and felt well. He denies present chills. He has had some mild blood in his urine with small clots but it has been intermittently clear.   09/01/2022: Mr. Raifsnider is a 67 year old man who underwent a TURP, cystolitholapaxy and urethral dilation 1 month ago. Since his procedure he endorses a very strong urinary stream although it does splay. He denies gross blood. He is urinating without straining and emptying his bladder well. OVerall, he is very pleased withhis results.   10/24/2022: 67 year old man who presents today with a weak urinary stream, thin stream, and difficulty starting his stream. He states he feels like his urine is backed up in his urethra and he will have to use his finger to milk the urethra in order to get his urine to flow. He does have a history of a urethral stricture requiring dilation in July. His symptoms began 10 days ago. He is able to urinate  and his PVR today is 268 mL.     ALLERGIES: No Known Drug Allergies    MEDICATIONS: Metformin Hcl 500 mg tablet  Glimepiride 2 mg tablet  Sildenafil Citrate 20 mg tablet  Valsartan-Hydrochlorothiazide 320 mg-25 mg tablet     GU PSH: Cystoscopy - 07/05/2022 Laser Surgery Prostate - 2016       PSH Notes: Laser Vaporization With Transurethral Resection Of Prostate, No Surgical Problems   NON-GU PSH: None   GU PMH: Bladder-neck stenosis/contracture - 09/01/2022, - 07/28/2022, - 07/05/2022 BPH w/LUTS - 09/01/2022, - 07/28/2022, - 07/05/2022, - 07/01/2022 (Stable), - 2019, BPH (benign prostatic hypertrophy) with urinary obstruction, - 2016 Incomplete bladder emptying - 09/01/2022, - 07/28/2022, - 07/05/2022, - 07/01/2022 Weak Urinary Stream - 07/05/2022, - 07/01/2022 Elevated PSA - 2019 Urinary Urgency (Stable) - 2019, Urinary urgency, - 2016 Low back pain, Lower back pain - 2016 Renal cyst, Renal cysts, acquired, bilateral - 2016, Complex renal cyst, - 2016 Renal calculus, Nephrolithiasis - 2016 Urinary Tract Inf, Unspec site, Bacteriuria with pyuria - 2016, UTI (urinary tract infection), bacterial, - 2016 Urinary Retention, Unspec, Incomplete bladder emptying - 2016 History of urolithiasis, History of renal calculi - 2015    NON-GU PMH: Encounter for general adult medical examination without abnormal findings, Encounter for preventive health examination - 2016 Glycosuria, Glucosuria - 2016 Personal history of other diseases of the circulatory system, History of hypertension - 2015  Personal history of other endocrine, nutritional and metabolic disease, History of diabetes mellitus - 2015 Diabetes Type 2 Hypertension    FAMILY HISTORY: 1 Daughter - Daughter Hypertension - Runs In Family malignant neoplasm - Runs In Family   SOCIAL HISTORY: Marital Status: Married Preferred Language: English; Race: White Current Smoking Status: Patient has never smoked.   Tobacco Use Assessment Completed:  Used Tobacco in last 30 days? Does drink.  Drinks 3 caffeinated drinks per day. Patient's occupation is/was Retired.     Notes: Never a smoker, No caffeine use, No alcohol use, Married, One child   REVIEW OF SYSTEMS:    GU Review Male:   Patient reports frequent urination, hard to postpone urination, get up at night to urinate, leakage of urine, and trouble starting your stream. Patient denies burning/ pain with urination, stream starts and stops, have to strain to urinate , erection problems, and penile pain.  Gastrointestinal (Upper):   Patient denies nausea, vomiting, and indigestion/ heartburn.  Gastrointestinal (Lower):   Patient denies diarrhea and constipation.  Constitutional:   Patient denies fever, night sweats, weight loss, and fatigue.  Skin:   Patient denies skin rash/ lesion and itching.  Eyes:   Patient denies blurred vision and double vision.  Ears/ Nose/ Throat:   Patient denies sore throat and sinus problems.  Hematologic/Lymphatic:   Patient denies swollen glands and easy bruising.  Cardiovascular:   Patient denies leg swelling and chest pains.  Respiratory:   Patient denies cough and shortness of breath.  Endocrine:   Patient denies excessive thirst.  Musculoskeletal:   Patient denies back pain and joint pain.  Neurological:   Patient denies headaches and dizziness.  Psychologic:   Patient denies depression and anxiety.   VITAL SIGNS:      10/24/2022 10:13 AM  BP 152/80 mmHg  Pulse 85 /min  Temperature 97.7 F / 36.5 C   GU PHYSICAL EXAMINATION:      Notes: Tight ureteral meatus and unable to pass a 14 straight catheter. Urethral meatus was dilated from a size 8 up to a 14 using a urethral dilator. The patient tolerated this very well. A 14 French catheter was able to be passed but I could not get it through to the bladder. I did not attempt to push it any further and instead remove the catheter. The patient was able to void with a slightly better stream but still  felt it was difficult.     Complexity of Data:  Source Of History:  Patient  Records Review:   Previous Doctor Records, Previous Patient Records  Urodynamics Review:   Review Bladder Scan   05/10/17 05/08/14  PSA  Total PSA 2.19 ng/mL 2.39     10/24/22  Urinalysis  Urine Appearance Cloudy   Urine Color Yellow   Urine Glucose 3+ mg/dL  Urine Bilirubin Neg mg/dL  Urine Ketones Neg mg/dL  Urine Specific Gravity 1.025   Urine Blood 2+ ery/uL  Urine pH 5.5   Urine Protein 1+ mg/dL  Urine Urobilinogen 0.2 mg/dL  Urine Nitrites Neg   Urine Leukocyte Esterase 3+ leu/uL  Urine WBC/hpf >60/hpf   Urine RBC/hpf 3 - 10/hpf   Urine Epithelial Cells 0 - 5/hpf   Urine Bacteria Many (>50/hpf)   Urine Mucous Not Present   Urine Yeast NS (Not Seen)   Urine Trichomonas Not Present   Urine Cystals NS (Not Seen)   Urine Casts NS (Not Seen)   Urine Sperm Not Present    PROCEDURES:  Meatal Dilation Sounds Initial - 11914  Risks, benefits, and some of the potential complications of the procedure were discussed at length with the patient including infection, bleeding, voiding discomfort, urinary retention, fever, chills, sepsis, and others. All questions were answered. Informed consent was obtained. Antibiotic prophylaxis was given. Sterile technique and intraurethral analgesia were used.  Meatus:  Severe stenosis. Normal location. Normal condition.    Sissy Hoff Sounds were used to dilate the meatus from _8_____ [place] to ___16___ [place].  The lower urinary tract was carefully examined. The procedure was well-tolerated and without complications. Antibiotic instructions were given. Instructions were given to call the office immediately for bloody urine, difficulty urinating, urinary retention, painful or frequent urination, fever, chills, nausea, vomiting or other illness. The patient stated that he understood these instructions and would comply with them.        PVR Ultrasound - 78295   Scanned Volume: 268 cc         Urinalysis w/Scope Dipstick Dipstick Cont'd Micro  Color: Yellow Bilirubin: Neg mg/dL WBC/hpf: >62/ZHY  Appearance: Cloudy Ketones: Neg mg/dL RBC/hpf: 3 - 86/VHQ  Specific Gravity: 1.025 Blood: 2+ ery/uL Bacteria: Many (>50/hpf)  pH: 5.5 Protein: 1+ mg/dL Cystals: NS (Not Seen)  Glucose: 3+ mg/dL Urobilinogen: 0.2 mg/dL Casts: NS (Not Seen)    Nitrites: Neg Trichomonas: Not Present    Leukocyte Esterase: 3+ leu/uL Mucous: Not Present      Epithelial Cells: 0 - 5/hpf      Yeast: NS (Not Seen)      Sperm: Not Present    ASSESSMENT:      ICD-10 Details  1 GU:   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 The patient has a history of a 5 [place] prostatic urethral stricture as well as a tight bladder neck, I did not want to injure anything and did not attempt to advance the catheter any further. I discussed all of this with our on-call physician who agreed with plan of care. I also discussed this with Dr. Arita Miss who commends taking him to the OR for diagnostic cystoscopy, Optilume balloon dilation, and possible TURP. The patient is voiding and therefore we opted to hold off on placing a catheter to see if he could get this completed during surgery. Green sheet placed today.

## 2022-11-12 DIAGNOSIS — H6122 Impacted cerumen, left ear: Secondary | ICD-10-CM | POA: Insufficient documentation

## 2022-11-12 DIAGNOSIS — H9042 Sensorineural hearing loss, unilateral, left ear, with unrestricted hearing on the contralateral side: Secondary | ICD-10-CM | POA: Insufficient documentation

## 2022-11-12 NOTE — Progress Notes (Signed)
Patient ID: Patrick Buckley, male   DOB: 1955/06/24, 67 y.o.   MRN: [id number]  CC: Left ear hearing loss  HPI:  Patrick Buckley is a 67 y.o. male who presents today complaining of decreased left ear hearing for the past 2 years.  He also reports occasional left ear pain.  His MRI scan last month was negative for retrocochlear lesion.  The patient was recently noted to have left ear cerumen impaction.  Currently he denies any otorrhea or vertigo.  He has no previous ENT surgery.  Past Medical History:  Diagnosis Date   BPH (benign prostatic hypertrophy)    Frequency of urination    History of kidney stones    Hypertension    Incomplete emptying of bladder    Neuromuscular disorder (HCC)    neuropathy   Neuropathy    feet   Type 2 diabetes, diet controlled (HCC)    Urgency of urination     Past Surgical History:  Procedure Laterality Date   CYSTOSCOPY WITH URETHRAL DILATATION N/A 07/26/2022   Procedure: CYSTOSCOPY WITH URETHRAL DILATATION;  Surgeon: Noel Christmas, MD;  Location: [facility];  Service: Urology;  Laterality: N/A;   GREEN LIGHT LASER TURP (TRANSURETHRAL RESECTION OF PROSTATE N/A 06/13/2014   Procedure: GREEN LIGHT LASER TURP (TRANSURETHRAL RESECTION OF PROSTATE WITH URETHRAL DILITATION;  Surgeon: Jerilee Field, MD;  Location: [facility];  Service: Urology;  Laterality: N/A;   NO PAST SURGERIES     TRANSURETHRAL RESECTION OF PROSTATE N/A 07/26/2022   Procedure: TRANSURETHRAL RESECTION OF THE PROSTATE (TURP);  Surgeon: Noel Christmas, MD;  Location: [facility];  Service: Urology;  Laterality: N/A;    History reviewed. No pertinent family history.  Social History:  reports that he has never smoked. He has never used smokeless tobacco. He reports current alcohol use. He reports that he does not use drugs.  Allergies:  Allergies  Allergen Reactions   Ace Inhibitors Cough   Amlodipine Besylate Palpitations     Prior to Admission medications   Medication Sig Start Date End Date Taking? Authorizing Provider  glimepiride (AMARYL) 2 MG tablet Take 2 mg by mouth daily.   Yes [provider]  metFORMIN (GLUCOPHAGE-XR) 500 MG 24 hr tablet Take 500 mg by mouth 2 (two) times daily.   Yes [provider]  sildenafil (REVATIO) 20 MG tablet Take 20 mg by mouth daily as needed.   Yes [provider]  valsartan-hydrochlorothiazide (DIOVAN-HCT) 320-25 MG tablet Take 1 tablet by mouth daily. 10/03/16  Yes [provider]  HYDROcodone-acetaminophen (NORCO/VICODIN) 5-325 MG tablet Take 1 tablet by mouth every 4 (four) hours as needed for moderate pain. Patient not taking: Reported on 11/08/2022 07/26/22 07/26/23  Kasandra Knudsen D, MD   Height 6\' 1"  (1.854 m), weight 215 lb (97.5 kg). Exam: General: Communicates without difficulty, well nourished, no acute distress. Head: Normocephalic, no evidence injury, no tenderness, facial buttresses intact without stepoff. Face/sinus: No tenderness to palpation and percussion. Facial movement is normal and symmetric. Eyes: PERRL, EOMI. No scleral icterus, conjunctivae clear. Neuro: CN II exam reveals vision grossly intact.  No nystagmus at any point of gaze. Ears: Auricles well formed without lesions.  Left ear cerumen impaction.  Nose: External evaluation reveals normal support and skin without lesions.  Dorsum is intact.  Anterior rhinoscopy reveals congested mucosa over anterior aspect of inferior turbinates and intact septum.  No purulence noted. Oral:  Oral cavity and oropharynx are intact,  symmetric, without erythema or edema.  Mucosa is moist without lesions. Neck: Full range of motion without pain.  There is no significant lymphadenopathy.  No masses palpable.  Thyroid bed within normal limits to palpation.  Parotid glands and submandibular glands equal bilaterally without mass.  Trachea is midline. Neuro:  CN 2-12 grossly intact.    Procedure: Left ear cerumen disimpaction Anesthesia: None Description: Under the operating microscope, the cerumen is carefully removed with a combination of cerumen currette, alligator forceps, and suction catheters.  After the cerumen is removed, the TMs are noted to be normal.  No mass, erythema, or lesions. The patient tolerated the procedure well.    His hearing test shows asymmetric left ear high-frequency sensorineural hearing loss.  Assessment: 1.  Left ear cerumen impaction.  After the disimpaction procedure, both tympanic membranes and middle ear spaces are noted to be normal. 2.  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  His MRI scan was negative for retrocochlear lesion.  Plan: 1.  Otomicroscopy with left ear cerumen disimpaction. 2.  The physical exam findings and the hearing test results are reviewed with the patient. 3.  The patient is a candidate for hearing amplification. 4.  The patient will return for reevaluation in 6 months.  Tamzin Bertling W Rushi Chasen 11/12/2022, 4:22 PM

## 2022-11-15 ENCOUNTER — Other Ambulatory Visit: Payer: Self-pay

## 2022-11-15 ENCOUNTER — Ambulatory Visit (HOSPITAL_COMMUNITY)
Admission: RE | Admit: 2022-11-15 | Discharge: 2022-11-15 | Disposition: A | Discharge status: Home / Self Care | Payer: Medicare Other | Attending: Urology | Admitting: Urology

## 2022-11-15 ENCOUNTER — Encounter (HOSPITAL_COMMUNITY)
Admission: RE | Disposition: A | Discharge status: Home / Self Care | Payer: Self-pay | Source: Home / Self Care | Attending: Urology

## 2022-11-15 ENCOUNTER — Ambulatory Visit (HOSPITAL_COMMUNITY): Payer: Medicare Other | Admitting: Anesthesiology

## 2022-11-15 ENCOUNTER — Ambulatory Visit (HOSPITAL_COMMUNITY): Payer: Medicare Other

## 2022-11-15 ENCOUNTER — Ambulatory Visit (HOSPITAL_BASED_OUTPATIENT_CLINIC_OR_DEPARTMENT_OTHER): Payer: Medicare Other | Admitting: Anesthesiology

## 2022-11-15 ENCOUNTER — Encounter (HOSPITAL_COMMUNITY): Payer: Self-pay | Admitting: Urology

## 2022-11-15 DIAGNOSIS — I10 Essential (primary) hypertension: Secondary | ICD-10-CM | POA: Diagnosis not present

## 2022-11-15 DIAGNOSIS — N320 Bladder-neck obstruction: Secondary | ICD-10-CM | POA: Insufficient documentation

## 2022-11-15 DIAGNOSIS — N35914 Unspecified anterior urethral stricture, male: Secondary | ICD-10-CM

## 2022-11-15 DIAGNOSIS — N401 Enlarged prostate with lower urinary tract symptoms: Secondary | ICD-10-CM | POA: Insufficient documentation

## 2022-11-15 DIAGNOSIS — N35919 Unspecified urethral stricture, male, unspecified site: Secondary | ICD-10-CM | POA: Insufficient documentation

## 2022-11-15 DIAGNOSIS — Z7984 Long term (current) use of oral hypoglycemic drugs: Secondary | ICD-10-CM | POA: Insufficient documentation

## 2022-11-15 DIAGNOSIS — Z8249 Family history of ischemic heart disease and other diseases of the circulatory system: Secondary | ICD-10-CM | POA: Diagnosis not present

## 2022-11-15 DIAGNOSIS — E119 Type 2 diabetes mellitus without complications: Secondary | ICD-10-CM | POA: Diagnosis not present

## 2022-11-15 HISTORY — PX: CYSTOSCOPY: SHX5120

## 2022-11-15 HISTORY — PX: CYSTOSCOPY WITH URETHRAL DILATATION: SHX5125

## 2022-11-15 LAB — BASIC METABOLIC PANEL
Anion gap: 10 (ref 5–15)
BUN: 32 mg/dL — ABNORMAL HIGH (ref 8–23)
CO2: 21 mmol/L — ABNORMAL LOW (ref 22–32)
Calcium: 9.5 mg/dL (ref 8.9–10.3)
Chloride: 106 mmol/L (ref 98–111)
Creatinine, Ser: 1.01 mg/dL (ref 0.61–1.24)
GFR, Estimated: >60 mL/min (ref >60)
Glucose, Bld: 212 mg/dL — ABNORMAL HIGH (ref 70–99)
Potassium: 3.1 mmol/L — ABNORMAL LOW (ref 3.5–5.1)
Sodium: 137 mmol/L (ref 135–145)

## 2022-11-15 LAB — GLUCOSE, CAPILLARY
Glucose-Capillary: 211 mg/dL — ABNORMAL HIGH (ref 70–99)
Glucose-Capillary: 182 mg/dL — ABNORMAL HIGH (ref 70–99)

## 2022-11-15 LAB — CBC
HCT: 41.2 % (ref 39.0–52.0)
Hemoglobin: 13.7 g/dL (ref 13.0–17.0)
MCH: 29.3 pg (ref 26.0–34.0)
MCHC: 33.3 g/dL (ref 30.0–36.0)
MCV: 88 fL (ref 80.0–100.0)
Platelets: 110 10*3/uL — ABNORMAL LOW (ref 150–400)
RBC: 4.68 MIL/uL (ref 4.22–5.81)
RDW: 13.7 % (ref 11.5–15.5)
WBC: 4.4 10*3/uL (ref 4.0–10.5)
nRBC: 0 % (ref 0.0–0.2)

## 2022-11-15 SURGERY — CYSTOSCOPY
Anesthesia: General

## 2022-11-15 MED ORDER — EPHEDRINE SULFATE (PRESSORS) 50 MG/ML IJ SOLN
INTRAMUSCULAR | Status: DC | PRN
  Administered 2022-11-15: 5 mg via INTRAVENOUS

## 2022-11-15 MED ORDER — MIDAZOLAM HCL 5 MG/5ML IJ SOLN
INTRAMUSCULAR | Status: DC | PRN
  Administered 2022-11-15: 2 mg via INTRAVENOUS

## 2022-11-15 MED ORDER — FENTANYL CITRATE (PF) 100 MCG/2ML IJ SOLN
INTRAMUSCULAR | Status: AC
  Filled 2022-11-15: qty 2

## 2022-11-15 MED ORDER — MIDAZOLAM HCL 2 MG/2ML IJ SOLN
INTRAMUSCULAR | Status: AC
  Filled 2022-11-15: qty 2

## 2022-11-15 MED ORDER — IOHEXOL 300 MG/ML  SOLN
INTRAMUSCULAR | Status: DC | PRN
  Administered 2022-11-15: 20 mL

## 2022-11-15 MED ORDER — ONDANSETRON HCL 4 MG/2ML IJ SOLN
INTRAMUSCULAR | Status: DC | PRN
  Administered 2022-11-15: 4 mg via INTRAVENOUS

## 2022-11-15 MED ORDER — LIDOCAINE HCL (CARDIAC) PF 100 MG/5ML IV SOSY
PREFILLED_SYRINGE | INTRAVENOUS | Status: DC | PRN
  Administered 2022-11-15: 100 mg via INTRAVENOUS

## 2022-11-15 MED ORDER — TRAMADOL HCL 50 MG PO TABS: 50.0000 mg | ORAL_TABLET | Freq: Four times a day (QID) | ORAL | 0 refills | Status: DC | PRN

## 2022-11-15 MED ORDER — SODIUM CHLORIDE 0.9 % IR SOLN
Status: DC | PRN
  Administered 2022-11-15: 3000 mL

## 2022-11-15 MED ORDER — PROPOFOL 10 MG/ML IV BOLUS
INTRAVENOUS | Status: DC | PRN
  Administered 2022-11-15: 200 mg via INTRAVENOUS

## 2022-11-15 MED ORDER — DEXAMETHASONE SODIUM PHOSPHATE 4 MG/ML IJ SOLN
INTRAMUSCULAR | Status: DC | PRN
  Administered 2022-11-15: 4 mg via INTRAVENOUS

## 2022-11-15 MED ORDER — INSULIN ASPART 100 UNIT/ML IJ SOLN
INTRAMUSCULAR | Status: AC
  Administered 2022-11-15: 4 [IU]
  Filled 2022-11-15: qty 1

## 2022-11-15 MED ORDER — ORAL CARE MOUTH RINSE: 15.0000 mL | Freq: Once | OROMUCOSAL | Status: AC

## 2022-11-15 MED ORDER — OXYCODONE HCL 5 MG/5ML PO SOLN: 5.0000 mg | Freq: Once | ORAL | Status: DC | PRN

## 2022-11-15 MED ORDER — PHENYLEPHRINE HCL (PRESSORS) 10 MG/ML IV SOLN
INTRAVENOUS | Status: DC | PRN
  Administered 2022-11-15: 80 ug via INTRAVENOUS
  Administered 2022-11-15: 240 ug via INTRAVENOUS
  Administered 2022-11-15 (×2): 160 ug via INTRAVENOUS

## 2022-11-15 MED ORDER — CHLORHEXIDINE GLUCONATE 0.12 % MT SOLN
15.0000 mL | Freq: Once | OROMUCOSAL | Status: AC
  Administered 2022-11-15: 15 mL via OROMUCOSAL

## 2022-11-15 MED ORDER — ONDANSETRON HCL 4 MG/2ML IJ SOLN: 4.0000 mg | Freq: Four times a day (QID) | INTRAMUSCULAR | Status: DC | PRN

## 2022-11-15 MED ORDER — EPHEDRINE 5 MG/ML INJ
INTRAVENOUS | Status: AC
  Filled 2022-11-15: qty 5

## 2022-11-15 MED ORDER — FENTANYL CITRATE (PF) 100 MCG/2ML IJ SOLN
INTRAMUSCULAR | Status: DC | PRN
  Administered 2022-11-15 (×2): 50 ug via INTRAVENOUS

## 2022-11-15 MED ORDER — LACTATED RINGERS IV SOLN: INTRAVENOUS | Status: DC

## 2022-11-15 MED ORDER — INSULIN ASPART 100 UNIT/ML IJ SOLN: 0.0000 [IU] | INTRAMUSCULAR | Status: DC | PRN

## 2022-11-15 MED ORDER — CEFAZOLIN SODIUM-DEXTROSE 2-4 GM/100ML-% IV SOLN
2.0000 g | INTRAVENOUS | Status: AC
  Administered 2022-11-15: 2 g via INTRAVENOUS
  Filled 2022-11-15: qty 100

## 2022-11-15 MED ORDER — PROPOFOL 1000 MG/100ML IV EMUL
INTRAVENOUS | Status: AC
  Filled 2022-11-15: qty 100

## 2022-11-15 MED ORDER — PHENYLEPHRINE 80 MCG/ML (10ML) SYRINGE FOR IV PUSH (FOR BLOOD PRESSURE SUPPORT)
PREFILLED_SYRINGE | INTRAVENOUS | Status: AC
  Filled 2022-11-15: qty 10

## 2022-11-15 MED ORDER — OXYCODONE HCL 5 MG PO TABS: 5.0000 mg | ORAL_TABLET | Freq: Once | ORAL | Status: DC | PRN

## 2022-11-15 MED ORDER — FENTANYL CITRATE PF 50 MCG/ML IJ SOSY: 25.0000 ug | PREFILLED_SYRINGE | INTRAMUSCULAR | Status: DC | PRN

## 2022-11-15 SURGICAL SUPPLY — 36 items
BAG DRN RND TRDRP ANRFLXCHMBR (UROLOGICAL SUPPLIES) ×1
BAG URINE DRAIN 2000ML AR STRL (UROLOGICAL SUPPLIES) ×1 IMPLANT
BAG URO CATCHER STRL LF (MISCELLANEOUS) ×1 IMPLANT
BALLN NEPHROSTOMY (BALLOONS)
BALLN OPTILUME DCB 30X5X75 (BALLOONS) ×1
BALLOON NEPHROSTOMY (BALLOONS) IMPLANT
BALLOON OPTILUME DCB 30X5X75 (BALLOONS) IMPLANT
CATH FOLEY 2W COUNCIL 20FR 5CC (CATHETERS) IMPLANT
CATH FOLEY 2W COUNCIL 5CC 16FR (CATHETERS) IMPLANT
CATH FOLEY 3WAY 30CC 22FR (CATHETERS) IMPLANT
CATH HEMA 3WAY 30CC 22FR COUDE (CATHETERS) IMPLANT
CATH ROBINSON RED A/P 14FR (CATHETERS) IMPLANT
CATH URET 5FR 70CM CONE TIP (BALLOONS) IMPLANT
CATH URETL OPEN END 6FR 70 (CATHETERS) ×1 IMPLANT
CLOTH BEACON ORANGE TIMEOUT ST (SAFETY) ×1 IMPLANT
DEVICE INFLATION ATRION QL4015 (MISCELLANEOUS) IMPLANT
DRAPE FOOT SWITCH (DRAPES) ×1 IMPLANT
GLOVE BIO SURGEON STRL SZ 6.5 (GLOVE) ×1 IMPLANT
GOWN STRL REUS W/ TWL LRG LVL3 (GOWN DISPOSABLE) ×1 IMPLANT
GOWN STRL REUS W/TWL LRG LVL3 (GOWN DISPOSABLE) ×1
GUIDEWIRE ANG ZIPWIRE 038X150 (WIRE) IMPLANT
GUIDEWIRE STR DUAL SENSOR (WIRE) ×1 IMPLANT
HOLDER FOLEY CATH W/STRAP (MISCELLANEOUS) ×1 IMPLANT
KIT TURNOVER KIT A (KITS) IMPLANT
LOOP CUT BIPOLAR 24F LRG (ELECTROSURGICAL) ×1 IMPLANT
MANIFOLD NEPTUNE II (INSTRUMENTS) ×1 IMPLANT
NS IRRIG 1000ML POUR BTL (IV SOLUTION) IMPLANT
PACK CYSTO (CUSTOM PROCEDURE TRAY) ×1 IMPLANT
PAD PREP 24X48 CUFFED NSTRL (MISCELLANEOUS) ×1 IMPLANT
PLUG CATH AND CAP STRL 200 (CATHETERS) ×1 IMPLANT
SYR 30ML LL (SYRINGE) IMPLANT
SYR TOOMEY IRRIG 70ML (MISCELLANEOUS) ×1
SYRINGE TOOMEY IRRIG 70ML (MISCELLANEOUS) ×1 IMPLANT
TUBING CONNECTING 10 (TUBING) ×1 IMPLANT
TUBING UROLOGY SET (TUBING) ×1 IMPLANT
WATER STERILE IRR 3000ML UROMA (IV SOLUTION) ×1 IMPLANT

## 2022-11-15 NOTE — Transfer of Care (Signed)
Immediate Anesthesia Transfer of Care Note  Patient: Patrick Buckley  Procedure(s) Performed: DIAGNOSTIC CYSTOSCOPY CYSTOSCOPY WITH OPTILUME URETHRAL DILATATION  Patient Location: PACU  Anesthesia Type:General  Level of Consciousness: drowsy  Airway & Oxygen Therapy: Patient Spontanous Breathing and Patient connected to face mask oxygen  Post-op Assessment: Report given to RN and Post -op Vital signs reviewed and stable  Post vital signs: Reviewed and stable  Last Vitals:  Vitals Value Taken Time  BP 105/65 11/15/22 0815  Temp    Pulse 66 11/15/22 0816  Resp 12 11/15/22 0816  SpO2 98 % 11/15/22 0816  Vitals shown include unfiled device data.  Last Pain:  Vitals:   11/15/22 0544  TempSrc: Oral  PainSc: 0-No pain         Complications: No notable events documented.

## 2022-11-15 NOTE — Anesthesia Preprocedure Evaluation (Signed)
Anesthesia Evaluation  Patient identified by MRN, date of birth, ID band Patient awake    Reviewed: Allergy & Precautions, H&P , NPO status , Patient's Chart, lab work & pertinent test results  Airway Mallampati: II   Neck ROM: full    Dental   Pulmonary neg pulmonary ROS   breath sounds clear to auscultation       Cardiovascular hypertension,  Rhythm:regular Rate:Normal     Neuro/Psych    GI/Hepatic   Endo/Other  diabetes, Type 2    Renal/GU stones     Musculoskeletal   Abdominal   Peds  Hematology   Anesthesia Other Findings   Reproductive/Obstetrics                             Anesthesia Physical Anesthesia Plan  ASA: 2  Anesthesia Plan: General   Post-op Pain Management:    Induction: Intravenous  PONV Risk Score and Plan: 2 and Ondansetron, Dexamethasone, Midazolam and Treatment may vary due to age or medical condition  Airway Management Planned: LMA  Additional Equipment:   Intra-op Plan:   Post-operative Plan: Extubation in OR  Informed Consent: I have reviewed the patients History and Physical, chart, labs and discussed the procedure including the risks, benefits and alternatives for the proposed anesthesia with the patient or authorized representative who has indicated his/her understanding and acceptance.     Dental advisory given  Plan Discussed with: CRNA, Anesthesiologist and Surgeon  Anesthesia Plan Comments:        Anesthesia Quick Evaluation

## 2022-11-15 NOTE — Anesthesia Procedure Notes (Signed)
Procedure Name: LMA Insertion Date/Time: 11/15/2022 7:40 AM  Performed by: Floydene Flock, CRNAPre-anesthesia Checklist: Patient identified, Emergency Drugs available, Suction available, Patient being monitored and Timeout performed Oxygen Delivery Method: Circle system utilized Preoxygenation: Pre-oxygenation with 100% oxygen Induction Type: IV induction LMA: LMA inserted LMA Size: 4.0 Number of attempts: 1 Placement Confirmation: positive ETCO2, CO2 detector and breath sounds checked- equal and bilateral Tube secured with: Tape Dental Injury: Teeth and Oropharynx as per pre-operative assessment

## 2022-11-15 NOTE — Op Note (Signed)
Operative Note  Preoperative diagnosis:  1.  Anterior urethral stricture  Postoperative diagnosis: 1.  Anterior urethral stricture  Procedure(s): 1.  Urethral stricture dilation - sounds and Optilume 2.  Diagnostic cystoscopy  Surgeon: Kasandra Knudsen, MD  Assistants:  None  Anesthesia:  General  Complications:  None  EBL:  minimal  Specimens: 1. none  Drains/Catheters: 1.  16Fr council tip foley  Intraoperative findings:   Fossa navicularis stricture Prostatic urethra with evidence of prior TUR, bladder neck wide open Bilateral orthotopic ureteral orifices Large capacity bladder with moderate to severe trabeculations, normal mucosa  Indication:  Patrick Buckley is a 67 y.o. male with history of BPH with LUTS who was previously undergone a PVP as well as a TURP also with a history of urethral stricture here for diagnostic cystoscopy and possible urethral stricture dilation.  Description of procedure: After risks and benefits of the procedure were discussed with the patient, informed consent was obtained. The patient was taken to the operating room and placed in the supine position. Anesthesia was induced and antibiotics were administered. He was then repositioned in the dorsal lithotomy position and prepped and draped in the usual sterile fashion. A time out was performed.   Attempts at placing a 21 French rigid cystoscope in the urethral meatus were unsuccessful.  A 0.38 sensor wire was then advanced through the urethral meatus and into the bladder.  Urethral dilators were then used from 12 [place] to 24 [place] over the wire.  After this was completed the 21 French rigid cystoscope was easily placed over the wire and advanced into the bladder.  There is evidence of prior TUR and the bladder neck was noted to be wide open.  Keeping the wire in place cystoscope was removed.  Next Optilume urethral balloon dilator was placed over the wire so that the radiopaque marker spanned  the strictured area the balloon was inflated to 10 mmHg and kept in place for 3 minutes.  It was then deflated and a 16 [place] council tip Foley catheter was placed over the wire.  It was inflated with 10 cc sterile water and hooked to a drainage bag.  The patient emerged from anesthesia and transferred the PACU in stable condition.   Plan: Plan to keep Foley catheter until 11/15.  Will discuss the possibility of CIC/shark tooth once a day to keep stricture open.

## 2022-11-15 NOTE — Anesthesia Postprocedure Evaluation (Signed)
Anesthesia Post Note  Patient: Patrick Buckley  Procedure(s) Performed: DIAGNOSTIC CYSTOSCOPY CYSTOSCOPY WITH OPTILUME URETHRAL DILATATION     Patient location during evaluation: PACU Anesthesia Type: General Level of consciousness: awake and alert Pain management: pain level controlled Vital Signs Assessment: post-procedure vital signs reviewed and stable Respiratory status: spontaneous breathing, nonlabored ventilation, respiratory function stable and patient connected to nasal cannula oxygen Cardiovascular status: blood pressure returned to baseline and stable Postop Assessment: no apparent nausea or vomiting Anesthetic complications: no   No notable events documented.  Last Vitals:  Vitals:   11/15/22 0858 11/15/22 0900  BP:  134/81  Pulse: 66 70  Resp: 12 18  Temp:  36.5 C  SpO2: 97% 100%    Last Pain:  Vitals:   11/15/22 0900  TempSrc:   PainSc: 0-No pain                 Decoda Van S

## 2022-11-15 NOTE — Discharge Instructions (Signed)
Cystoscopy with Urethral dilation (optilume) patient instructions  Following a cystoscopy, a catheter (a flexible rubber tube) is sometimes left in place to empty the bladder. This may cause some discomfort or a feeling that you need to urinate. Your doctor determines the period of time that the catheter will be left in place. You may have bloody urine for two to three days (Call your doctor if the amount of bleeding increases or does not subside).  You may pass blood clots in your urine, especially if you had a biopsy. It is not unusual to pass small blood clots and have some bloody urine a couple of weeks after your cystoscopy. Again, call your doctor if the bleeding does not subside. You may have: Dysuria (painful urination) Frequency (urinating often) Urgency (strong desire to urinate)  These symptoms are common especially if medicine is instilled into the bladder or a ureteral stent is placed. Avoiding alcohol and caffeine, such as coffee, tea, and chocolate, may help relieve these symptoms. Drink plenty of water, unless otherwise instructed. Your doctor may also prescribe an antibiotic or other medicine to reduce these symptoms.  Please use a condom for the next 30 days.   Cystoscopy results are available soon after the procedure; biopsy results usually take two to four days. Your doctor will discuss the results of your exam with you. Before you go home, you will be given specific instructions for follow-up care. Special Instructions:   If you are going home with a catheter in place do not take a tub bath until removed by your doctor.   You may resume your normal activities.   Do not drive or operate machinery if you are taking narcotic pain medicine.   Be sure to keep all follow-up appointments with your doctor.   Call Your Doctor If: The catheter is not draining You have severe pain You are unable to urinate You have a fever over 101 You have severe bleeding

## 2022-11-15 NOTE — Interval H&P Note (Signed)
History and Physical Interval Note:  11/15/2022 7:19 AM  Patrick Buckley  has presented today for surgery, with the diagnosis of URETHRAL STRICTURE, BLADDER NECK CONTRACTURE.  The various methods of treatment have been discussed with the patient and family. After consideration of risks, benefits and other options for treatment, the patient has consented to  Procedure(s) with comments: DIAGNOSTIC CYSTOSCOPY (N/A) - 75 MINUTES POSSIBLE TRANSURETHRAL RESECTION OF THE PROSTATE (TURP) (N/A) CYSTOSCOPY WITH  POSSIBLE OPTILUME URETHRAL DILATATION (N/A) as a surgical intervention.  The patient's history has been reviewed, patient examined, no change in status, stable for surgery.  I have reviewed the patient's chart and labs.  Questions were answered to the patient's satisfaction.     Jaecion Dempster D Jann Milkovich

## 2022-11-16 ENCOUNTER — Encounter (HOSPITAL_COMMUNITY): Payer: Self-pay | Admitting: Urology

## 2022-11-28 ENCOUNTER — Other Ambulatory Visit: Payer: Self-pay | Admitting: Urology

## 2022-12-14 ENCOUNTER — Encounter (HOSPITAL_BASED_OUTPATIENT_CLINIC_OR_DEPARTMENT_OTHER): Payer: Self-pay | Admitting: Urology

## 2022-12-14 ENCOUNTER — Other Ambulatory Visit: Payer: Self-pay

## 2022-12-14 NOTE — Progress Notes (Signed)
Spoke w/ via phone for pre-op interview---[facility] needs dos----   ISTAT      Lab results------07/26/22 EKG in chart & Epic COVID test -----patient states asymptomatic no test needed Arrive at -------0930 on Tuesday, 12/20/22 NPO after MN NO Solid Food.  Clear liquids from MN until---0830 Med rec completed Medications to take morning of surgery -----none Diabetic medication -----Do not take Glimepiride or Metformin on the morning of surgery. Patient instructed no nail polish to be worn day of surgery Patient instructed to bring photo id and insurance card day of surgery Patient aware to have Driver (ride ) / caregiver    for 24 hours after surgery - wife, Lynden Ang Patient Special Instructions -----none Pre-Op special Instructions -----none Patient verbalized understanding of instructions that were given at this phone interview. Patient denies chest pain, sob, fever, cough at the interview.

## 2022-12-16 NOTE — H&P (Signed)
CC/HPI: cc: Urinary frequency/urgency   07/01/2022: 67 year old man with a history of BPH with LUTS who underwent PVP in 2016 also with a history of an elevated PSA. He was last seen in 2019 by Dr. Mena Goes. Over the last 2 to 3 months patient's been treated with 3 rounds of antibiotics for UTI and has had a difficult time emptying his bladder with a weak urinary stream. He feels like symptoms are progressively getting worse. He did try tamsulosin for short period but is unsure if it helped him. Most recent PSA is 3.1 on 06/14/2022. PVR greater than 300 cc today.   IPSS: 2, 0, 2, 2, 3, 2, 1= 12/35  Mostly dissatisfied quality of life 4/6   PSA Hx:  May 2016 PSA 2.39  Apr 2018 PSA 2.99  Mar 2019 PSA 5.37 --> recheck 4.9 with 13% free.   07/05/2022: 67 year old man with a history of BPH with LUTS who underwent a PVP in 2016 now with worsening force of urinary stream.   07/28/2022: 67 year old man who underwent a TURP, cystolitholopaxy, and urethral dilation 2 days ago presents today for voiding trial. Postoperatively he has been doing well and felt well. He denies present chills. He has had some mild blood in his urine with small clots but it has been intermittently clear.   09/01/2022: Mr. Rousselle is a 67 year old man who underwent a TURP, cystolitholapaxy and urethral dilation 1 month ago. Since his procedure he endorses a very strong urinary stream although it does splay. He denies gross blood. He is urinating without straining and emptying his bladder well. OVerall, he is very pleased withhis results.   10/24/2022: 67 year old man who presents today with a weak urinary stream, thin stream, and difficulty starting his stream. He states he feels like his urine is backed up in his urethra and he will have to use his finger to milk the urethra in order to get his urine to flow. He does have a history of a urethral stricture requiring dilation in July. His symptoms began 10 days ago. He is able to urinate  and his PVR today is 268 mL.   11/18/2022: 67 year old man with a history of BPH with LUTS s/p TURP, cystolitholopaxy, and urethral dilation July 2024 who recently underwent diagnostic cystoscopy with dilation of fossa navicularis stricture 3 days ago. He successfully passed void trial today. He is also been experiencing difficulty retracting foreskin and is interested in circumcision.     ALLERGIES: No Known Drug Allergies    MEDICATIONS: Metformin Hcl 500 mg tablet  Glimepiride 2 mg tablet  Sildenafil Citrate 20 mg tablet  Valsartan-Hydrochlorothiazide 320 mg-25 mg tablet     GU PSH: Cystoscopy - 07/05/2022 Initial Male VB Sounds - 10/24/2022 Laser Surgery Prostate - 2016       PSH Notes: Laser Vaporization With Transurethral Resection Of Prostate, No Surgical Problems   NON-GU PSH: No Non-GU PSH    GU PMH: Bladder-neck stenosis/contracture - 10/24/2022, - 09/01/2022, - 07/28/2022, - 07/05/2022 Meatal stenosis (other urethral stricture) - 10/24/2022 BPH w/LUTS - 09/01/2022, - 07/28/2022, - 07/05/2022, - 07/01/2022 (Stable), - 2019, BPH (benign prostatic hypertrophy) with urinary obstruction, - 2016 Incomplete bladder emptying - 09/01/2022, - 07/28/2022, - 07/05/2022, - 07/01/2022 Weak Urinary Stream - 07/05/2022, - 07/01/2022 Elevated PSA - 2019 Urinary Urgency (Stable) - 2019, Urinary urgency, - 2016 Low back pain, Lower back pain - 2016 Renal cyst, Renal cysts, acquired, bilateral - 2016, Complex renal cyst, - 2016 Renal calculus, Nephrolithiasis - 2016 Urinary Tract  Inf, Unspec site, Bacteriuria with pyuria - 2016, UTI (urinary tract infection), bacterial, - 2016 Urinary Retention, Unspec, Incomplete bladder emptying - 2016 History of urolithiasis, History of renal calculi - 2015    NON-GU PMH: Encounter for general adult medical examination without abnormal findings, Encounter for preventive health examination - 2016 Glycosuria, Glucosuria - 2016 Personal history of other diseases of the  circulatory system, History of hypertension - 2015 Personal history of other endocrine, nutritional and metabolic disease, History of diabetes mellitus - 2015 Diabetes Type 2 Hypertension    FAMILY HISTORY: 1 Daughter - Daughter Hypertension - Runs In Family malignant neoplasm - Runs In Family   SOCIAL HISTORY: Marital Status: Married Preferred Language: English; Race: White Current Smoking Status: Patient has never smoked.   Tobacco Use Assessment Completed: Used Tobacco in last 30 days? Does drink.  Drinks 3 caffeinated drinks per day. Patient's occupation is/was Retired.     Notes: Never a smoker, No caffeine use, No alcohol use, Married, One child   REVIEW OF SYSTEMS:    GU Review Male:   Patient denies frequent urination, hard to postpone urination, burning/ pain with urination, get up at night to urinate, leakage of urine, stream starts and stops, trouble starting your stream, have to strain to urinate , erection problems, and penile pain.  Gastrointestinal (Upper):   Patient denies nausea, vomiting, and indigestion/ heartburn.  Gastrointestinal (Lower):   Patient denies diarrhea and constipation.  Constitutional:   Patient denies fever, night sweats, weight loss, and fatigue.  Skin:   Patient denies skin rash/ lesion and itching.  Eyes:   Patient denies blurred vision and double vision.  Ears/ Nose/ Throat:   Patient denies sore throat and sinus problems.  Hematologic/Lymphatic:   Patient denies swollen glands and easy bruising.  Cardiovascular:   Patient denies leg swelling and chest pains.  Respiratory:   Patient denies shortness of breath and cough.  Endocrine:   Patient denies excessive thirst.  Musculoskeletal:   Patient denies back pain and joint pain.  Neurological:   Patient denies headaches and dizziness.  Psychologic:   Patient denies depression and anxiety.   VITAL SIGNS: None   GU PHYSICAL EXAMINATION:      Notes: thickened foreskin, retracts but is  somewhat tight   MULTI-SYSTEM PHYSICAL EXAMINATION:    Constitutional: Well-nourished. No physical deformities. Normally developed. Good grooming.  Neck: Neck symmetrical, not swollen. Normal tracheal position.  Respiratory: No labored breathing, no use of accessory muscles.   Skin: No paleness, no jaundice, no cyanosis. No lesion, no ulcer, no rash.  Neurologic / Psychiatric: Oriented to time, oriented to place, oriented to person. No depression, no anxiety, no agitation.  Eyes: Normal conjunctivae. Normal eyelids.  Ears, Nose, Mouth, and Throat: Left ear no scars, no lesions, no masses. Right ear no scars, no lesions, no masses. Nose no scars, no lesions, no masses. Normal hearing. Normal lips.  Musculoskeletal: Normal gait and station of head and neck.     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 Urethral stricture: Patient passed void trial today. He was also given a dilator so that he can self dilate once a day and instructed how to do this.   2. BPH with LUTS: Diagnostic cystoscopy confirmed that bladder neck was wide open following TURP.   3. Phimosis: Patient is having difficulty retracting foreskin and foreskin is thickened around glans. We discussed moving forward with a circumcision. Risks and benefits of the procedure were discussed with patient detail including but not limited to pain, bleeding, infection, poor cosmesis, change in  sensation of glans, demonstrating structures. We reviewed postoperative limitations as well.   Schedule next available circumcision date

## 2022-12-20 ENCOUNTER — Encounter (HOSPITAL_BASED_OUTPATIENT_CLINIC_OR_DEPARTMENT_OTHER): Payer: Self-pay | Admitting: Urology

## 2022-12-20 ENCOUNTER — Other Ambulatory Visit: Payer: Self-pay

## 2022-12-20 ENCOUNTER — Ambulatory Visit (HOSPITAL_BASED_OUTPATIENT_CLINIC_OR_DEPARTMENT_OTHER): Payer: Medicare Other | Admitting: Certified Registered Nurse Anesthetist

## 2022-12-20 ENCOUNTER — Encounter (HOSPITAL_BASED_OUTPATIENT_CLINIC_OR_DEPARTMENT_OTHER)
Admission: RE | Disposition: A | Discharge status: Home / Self Care | Payer: Self-pay | Source: Home / Self Care | Attending: Urology

## 2022-12-20 ENCOUNTER — Ambulatory Visit (HOSPITAL_BASED_OUTPATIENT_CLINIC_OR_DEPARTMENT_OTHER)
Admission: RE | Admit: 2022-12-20 | Discharge: 2022-12-20 | Disposition: A | Discharge status: Home / Self Care | Payer: Medicare Other | Attending: Urology | Admitting: Urology

## 2022-12-20 DIAGNOSIS — N401 Enlarged prostate with lower urinary tract symptoms: Secondary | ICD-10-CM | POA: Diagnosis not present

## 2022-12-20 DIAGNOSIS — R338 Other retention of urine: Secondary | ICD-10-CM | POA: Diagnosis not present

## 2022-12-20 DIAGNOSIS — Z9079 Acquired absence of other genital organ(s): Secondary | ICD-10-CM | POA: Insufficient documentation

## 2022-12-20 DIAGNOSIS — N471 Phimosis: Secondary | ICD-10-CM

## 2022-12-20 DIAGNOSIS — Z01818 Encounter for other preprocedural examination: Secondary | ICD-10-CM

## 2022-12-20 DIAGNOSIS — N35919 Unspecified urethral stricture, male, unspecified site: Secondary | ICD-10-CM | POA: Insufficient documentation

## 2022-12-20 HISTORY — PX: CIRCUMCISION: SHX1350

## 2022-12-20 HISTORY — DX: Diverticulosis of intestine, part unspecified, without perforation or abscess without bleeding: K57.90

## 2022-12-20 HISTORY — DX: Personal history of colon polyps, unspecified: Z86.0100

## 2022-12-20 LAB — POCT I-STAT, CHEM 8
BUN: 29 mg/dL — ABNORMAL HIGH (ref 8–23)
Calcium, Ion: 1.3 mmol/L (ref 1.15–1.40)
Chloride: 103 mmol/L (ref 98–111)
Creatinine, Ser: 1 mg/dL (ref 0.61–1.24)
Glucose, Bld: 295 mg/dL — ABNORMAL HIGH (ref 70–99)
HCT: 45 % (ref 39.0–52.0)
Hemoglobin: 15.3 g/dL (ref 13.0–17.0)
Potassium: 4 mmol/L (ref 3.5–5.1)
Sodium: 139 mmol/L (ref 135–145)
TCO2: 25 mmol/L (ref 22–32)

## 2022-12-20 LAB — GLUCOSE, CAPILLARY: Glucose-Capillary: 240 mg/dL — ABNORMAL HIGH (ref 70–99)

## 2022-12-20 SURGERY — CIRCUMCISION, ADULT
Anesthesia: General | Site: Penis

## 2022-12-20 MED ORDER — TRAMADOL HCL 50 MG PO TABS: 50.0000 mg | ORAL_TABLET | Freq: Four times a day (QID) | ORAL | 0 refills | Status: AC | PRN

## 2022-12-20 MED ORDER — PROPOFOL 10 MG/ML IV BOLUS
INTRAVENOUS | Status: AC
  Filled 2022-12-20: qty 20

## 2022-12-20 MED ORDER — CEFAZOLIN SODIUM-DEXTROSE 2-4 GM/100ML-% IV SOLN
2.0000 g | INTRAVENOUS | Status: AC
  Administered 2022-12-20: 2 g via INTRAVENOUS

## 2022-12-20 MED ORDER — MIDAZOLAM HCL 2 MG/2ML IJ SOLN
INTRAMUSCULAR | Status: AC
Start: 2022-12-20 — End: ?
  Filled 2022-12-20: qty 2

## 2022-12-20 MED ORDER — MIDAZOLAM HCL 5 MG/5ML IJ SOLN
INTRAMUSCULAR | Status: DC | PRN
  Administered 2022-12-20: 2 mg via INTRAVENOUS

## 2022-12-20 MED ORDER — BUPIVACAINE HCL (PF) 0.25 % IJ SOLN
INTRAMUSCULAR | Status: DC | PRN
  Administered 2022-12-20: 10 mL

## 2022-12-20 MED ORDER — DEXAMETHASONE SODIUM PHOSPHATE 10 MG/ML IJ SOLN
INTRAMUSCULAR | Status: DC | PRN
  Administered 2022-12-20: 5 mg via INTRAVENOUS

## 2022-12-20 MED ORDER — ONDANSETRON HCL 4 MG/2ML IJ SOLN: 4.0000 mg | Freq: Four times a day (QID) | INTRAMUSCULAR | Status: DC | PRN

## 2022-12-20 MED ORDER — ONDANSETRON HCL 4 MG/2ML IJ SOLN
INTRAMUSCULAR | Status: DC | PRN
  Administered 2022-12-20: 4 mg via INTRAVENOUS

## 2022-12-20 MED ORDER — PHENYLEPHRINE 80 MCG/ML (10ML) SYRINGE FOR IV PUSH (FOR BLOOD PRESSURE SUPPORT)
PREFILLED_SYRINGE | INTRAVENOUS | Status: DC | PRN
  Administered 2022-12-20 (×4): 160 ug via INTRAVENOUS
  Administered 2022-12-20: 240 ug via INTRAVENOUS
  Administered 2022-12-20: 80 ug via INTRAVENOUS
  Administered 2022-12-20: 160 ug via INTRAVENOUS

## 2022-12-20 MED ORDER — PHENYLEPHRINE 80 MCG/ML (10ML) SYRINGE FOR IV PUSH (FOR BLOOD PRESSURE SUPPORT)
PREFILLED_SYRINGE | INTRAVENOUS | Status: AC
  Filled 2022-12-20: qty 10

## 2022-12-20 MED ORDER — FENTANYL CITRATE (PF) 100 MCG/2ML IJ SOLN: 25.0000 ug | INTRAMUSCULAR | Status: DC | PRN

## 2022-12-20 MED ORDER — PROPOFOL 10 MG/ML IV BOLUS
INTRAVENOUS | Status: DC | PRN
  Administered 2022-12-20: 20 mg via INTRAVENOUS
  Administered 2022-12-20: 200 mg via INTRAVENOUS

## 2022-12-20 MED ORDER — LACTATED RINGERS IV SOLN
INTRAVENOUS | Status: DC
Start: 2022-12-20 — End: 2022-12-20

## 2022-12-20 MED ORDER — EPHEDRINE SULFATE-NACL 50-0.9 MG/10ML-% IV SOSY
PREFILLED_SYRINGE | INTRAVENOUS | Status: DC | PRN
  Administered 2022-12-20 (×2): 2.5 mg via INTRAVENOUS

## 2022-12-20 MED ORDER — OXYCODONE HCL 5 MG/5ML PO SOLN: 5.0000 mg | Freq: Once | ORAL | Status: DC | PRN

## 2022-12-20 MED ORDER — FENTANYL CITRATE (PF) 100 MCG/2ML IJ SOLN
INTRAMUSCULAR | Status: DC | PRN
  Administered 2022-12-20: 50 ug via INTRAVENOUS

## 2022-12-20 MED ORDER — FENTANYL CITRATE (PF) 100 MCG/2ML IJ SOLN
INTRAMUSCULAR | Status: AC
  Filled 2022-12-20: qty 2

## 2022-12-20 MED ORDER — LIDOCAINE HCL (PF) 2 % IJ SOLN
INTRAMUSCULAR | Status: AC
  Filled 2022-12-20: qty 10

## 2022-12-20 MED ORDER — STERILE WATER FOR IRRIGATION IR SOLN
Status: DC | PRN
  Administered 2022-12-20: 500 mL

## 2022-12-20 MED ORDER — ONDANSETRON HCL 4 MG/2ML IJ SOLN
INTRAMUSCULAR | Status: AC
  Filled 2022-12-20: qty 2

## 2022-12-20 MED ORDER — OXYCODONE HCL 5 MG PO TABS: 5.0000 mg | ORAL_TABLET | Freq: Once | ORAL | Status: DC | PRN

## 2022-12-20 MED ORDER — LIDOCAINE 2% (20 MG/ML) 5 ML SYRINGE
INTRAMUSCULAR | Status: DC | PRN
  Administered 2022-12-20: 60 mg via INTRAVENOUS

## 2022-12-20 MED ORDER — CEFAZOLIN SODIUM-DEXTROSE 2-4 GM/100ML-% IV SOLN
INTRAVENOUS | Status: AC
  Filled 2022-12-20: qty 100

## 2022-12-20 MED ORDER — LIDOCAINE HCL 1 % IJ SOLN
INTRAMUSCULAR | Status: DC | PRN
  Administered 2022-12-20: 10 mL

## 2022-12-20 MED ORDER — EPHEDRINE 5 MG/ML INJ
INTRAVENOUS | Status: AC
  Filled 2022-12-20: qty 5

## 2022-12-20 MED ORDER — DEXAMETHASONE SODIUM PHOSPHATE 10 MG/ML IJ SOLN
INTRAMUSCULAR | Status: AC
  Filled 2022-12-20: qty 1

## 2022-12-20 SURGICAL SUPPLY — 25 items
BLADE SURG 15 STRL LF DISP TIS (BLADE) ×1 IMPLANT
BNDG COHESIVE 1X5 TAN STRL LF (GAUZE/BANDAGES/DRESSINGS) ×1 IMPLANT
COVER BACK TABLE 60X90IN (DRAPES) ×1 IMPLANT
COVER MAYO STAND STRL (DRAPES) ×1 IMPLANT
DRAPE LAPAROTOMY T 98X78 PEDS (DRAPES) ×1 IMPLANT
ELECT REM PT RETURN 9FT ADLT (ELECTROSURGICAL) ×1
ELECTRODE REM PT RTRN 9FT ADLT (ELECTROSURGICAL) ×1 IMPLANT
GAUZE 4X4 16PLY ~~LOC~~+RFID DBL (SPONGE) ×1 IMPLANT
GAUZE SPONGE 4X4 12PLY STRL (GAUZE/BANDAGES/DRESSINGS) ×1 IMPLANT
GAUZE XEROFORM 1X8 LF (GAUZE/BANDAGES/DRESSINGS) ×1 IMPLANT
GLOVE BIO SURGEON STRL SZ 6.5 (GLOVE) ×1 IMPLANT
GOWN STRL REUS W/TWL LRG LVL3 (GOWN DISPOSABLE) ×1 IMPLANT
KIT TURNOVER CYSTO (KITS) ×1 IMPLANT
NDL HYPO 22X1.5 SAFETY MO (MISCELLANEOUS) ×1 IMPLANT
NEEDLE HYPO 22X1.5 SAFETY MO (MISCELLANEOUS) ×1
NS IRRIG 1000ML POUR BTL (IV SOLUTION) IMPLANT
PACK BASIN DAY SURGERY FS (CUSTOM PROCEDURE TRAY) ×1 IMPLANT
PENCIL SMOKE EVACUATOR (MISCELLANEOUS) IMPLANT
SLEEVE SCD COMPRESS KNEE MED (STOCKING) ×1 IMPLANT
SUT CHROMIC 3 0 SH 27 (SUTURE) ×2 IMPLANT
SUT CHROMIC 4 0 SH 27 (SUTURE) ×1 IMPLANT
SYR BULB EAR ULCER 3OZ GRN STR (SYRINGE) ×1 IMPLANT
SYR CONTROL 10ML LL (SYRINGE) IMPLANT
TOWEL OR 17X24 6PK STRL BLUE (TOWEL DISPOSABLE) ×1 IMPLANT
WATER STERILE IRR 500ML POUR (IV SOLUTION) IMPLANT

## 2022-12-20 NOTE — Anesthesia Preprocedure Evaluation (Signed)
Anesthesia Evaluation  Patient identified by MRN, date of birth, ID band Patient awake    Reviewed: Allergy & Precautions, H&P , NPO status , Patient's Chart, lab work & pertinent test results  Airway Mallampati: II   Neck ROM: full    Dental   Pulmonary neg pulmonary ROS   breath sounds clear to auscultation       Cardiovascular hypertension,  Rhythm:regular Rate:Normal     Neuro/Psych  Neuromuscular disease    GI/Hepatic   Endo/Other  diabetes, Type 2    Renal/GU      Musculoskeletal   Abdominal   Peds  Hematology   Anesthesia Other Findings   Reproductive/Obstetrics                             Anesthesia Physical Anesthesia Plan  ASA: 3  Anesthesia Plan: General   Post-op Pain Management:    Induction: Intravenous  PONV Risk Score and Plan: 2 and Ondansetron, Dexamethasone, Midazolam and Treatment may vary due to age or medical condition  Airway Management Planned: LMA  Additional Equipment:   Intra-op Plan:   Post-operative Plan: Extubation in OR  Informed Consent: I have reviewed the patients History and Physical, chart, labs and discussed the procedure including the risks, benefits and alternatives for the proposed anesthesia with the patient or authorized representative who has indicated his/her understanding and acceptance.     Dental advisory given  Plan Discussed with: CRNA, Anesthesiologist and Surgeon  Anesthesia Plan Comments:        Anesthesia Quick Evaluation

## 2022-12-20 NOTE — Anesthesia Procedure Notes (Signed)
Procedure Name: LMA Insertion Date/Time: 12/20/2022 11:24 AM  Performed by: Bishop Limbo, CRNAPre-anesthesia Checklist: Patient identified, Emergency Drugs available, Suction available and Patient being monitored Patient Re-evaluated:Patient Re-evaluated prior to induction Oxygen Delivery Method: Circle System Utilized Preoxygenation: Pre-oxygenation with 100% oxygen Induction Type: IV induction Ventilation: Mask ventilation without difficulty LMA: LMA inserted LMA Size: 5.0 Number of attempts: 1 Placement Confirmation: positive ETCO2 Tube secured with: Tape Dental Injury: Teeth and Oropharynx as per pre-operative assessment

## 2022-12-20 NOTE — Interval H&P Note (Signed)
History and Physical Interval Note:  12/20/2022 10:58 AM  Patrick Buckley  has presented today for surgery, with the diagnosis of PHIMOSIS.  The various methods of treatment have been discussed with the patient and family. After consideration of risks, benefits and other options for treatment, the patient has consented to  Procedure(s) with comments: CIRCUMCISION ADULT (N/A) - 60 MINUTES as a surgical intervention.  The patient's history has been reviewed, patient examined, no change in status, stable for surgery.  I have reviewed the patient's chart and labs.  Questions were answered to the patient's satisfaction.     Sachi Boulay D Annell Canty

## 2022-12-20 NOTE — Transfer of Care (Signed)
Immediate Anesthesia Transfer of Care Note  Patient: Patrick Buckley  Procedure(s) Performed: CIRCUMCISION ADULT (Penis)  Patient Location: PACU  Anesthesia Type:General  Level of Consciousness: awake, alert , oriented, and patient cooperative  Airway & Oxygen Therapy: Patient Spontanous Breathing  Post-op Assessment: Report given to RN and Post -op Vital signs reviewed and stable  Post vital signs: Reviewed and stable  Last Vitals:  Vitals Value Taken Time  BP    Temp    Pulse 96 12/20/22 1221  Resp 19 12/20/22 1221  SpO2 98 % 12/20/22 1221  Vitals shown include unfiled device data.  Last Pain:  Vitals:   12/20/22 0949  TempSrc: Oral  PainSc: 0-No pain      Patients Stated Pain Goal: 7 (12/20/22 0949)  Complications: No notable events documented.

## 2022-12-20 NOTE — Discharge Instructions (Addendum)
Postoperative instructions for circumcision  Wound:  In most cases your incision will have absorbable sutures that run along the course of your incision and will dissolve within the first 10-20 days. Some will fall out even earlier. Expect some redness as the sutures dissolved but this should occur only around the sutures. If there is generalized redness, especially with increasing pain or swelling, let us know. The penis will very likely get "black and blue" as the blood in the tissues spread. Sometimes the whole penis will turn colors. The black and blue is followed by a yellow and brown color. In time, all the discoloration will go away.  Diet:  You may return to your normal diet within 24 hours following your surgery. You may note some mild nausea and possibly vomiting the first 6-8 hours following surgery. This is usually due to the side effects of anesthesia, and will disappear quite soon. I would suggest clear liquids and a very light meal the first evening following your surgery.  Activity:  Your physical activity should be restricted the first 48 hours. During that time you should remain relatively inactive, moving about only when necessary. During the first 7-10 days following surgery he should avoid lifting any heavy objects (anything greater than 15 pounds), and avoid strenuous exercise. If you work, ask [place] specifically about your restrictions, both for work and home. We will write a note to your employer if needed.  Ice packs can be placed on and off over the penis for the first 48 hours to help relieve the pain and keep the swelling down. Frozen peas or corn in a ZipLock bag can be frozen, used and re-frozen. Fifteen minutes on and 15 minutes off is a reasonable schedule.   Hygiene:  You may shower 48 hours after your surgery. Tub bathing should be restricted until the seventh day.  Medication:  You will be sent home with some type of pain medication. In many cases you will be  sent home with a narcotic pain pill (Vicodin or Tylox). If the pain is not too bad, you may take either Tylenol (acetaminophen) or Advil (ibuprofen) which contain no narcotic agents, and might be tolerated a little better, with fewer side effects. If the pain medication you are sent home with does not control the pain, you will have to let us know. Some narcotic pain medications cannot be given or refilled by a phone call to a pharmacy.  Problems you should report to [place]:  Fever of 101.0 degrees Fahrenheit or greater. Moderate or severe swelling under the skin incision or involving the scrotum. Drug reaction such as hives, a rash, nausea or vomiting.    Post Anesthesia Home Care Instructions  Activity: Get plenty of rest for the remainder of the day. A responsible individual must stay with you for 24 hours following the procedure.  For the next 24 hours, DO NOT: -Drive a car -Advertising copywriter -Drink alcoholic beverages -Take any medication unless instructed by your physician -Make any legal decisions or sign important papers.  Meals: Start with liquid foods such as gelatin or soup. Progress to regular foods as tolerated. Avoid greasy, spicy, heavy foods. If nausea and/or vomiting occur, drink only clear liquids until the nausea and/or vomiting subsides. Call your physician if vomiting continues.  Special Instructions/Symptoms: Your throat may feel dry or sore from the anesthesia or the breathing tube placed in your throat during surgery. If this causes discomfort, gargle with warm salt water. The discomfort should disappear within  24 hours.

## 2022-12-20 NOTE — Op Note (Signed)
Preoperative diagnosis:  phimosis   Postoperative diagnosis:  Same   Procedure: Circumcision  Surgeon: Kasandra Knudsen, MD  Anesthesia: General  Complications: None  Intraoperative findings: tight phimotic ring  EBL: Minimal  Findings: Mild penile torsion (ventral median raphe off midline) Penoscrotal webbing Penis appeared straight when placed on gentle traction   Specimens: None  Indication: Patrick Buckley is a 67 y.o. patient with phimosis and difficulty retracting foreskin.  After reviewing the management options for treatment, he elected to proceed with the above surgical procedure(s). We have discussed the potential benefits and risks of the procedure, side effects of the proposed treatment, the likelihood of the patient achieving the goals of the procedure, and any potential problems that might occur during the procedure or recuperation. Informed consent has been obtained.  Procedure:  After general anesthesia was induced the patient was prepped and draped in the routine sterile fashion. A penile block and ring block was then performed with local anesthetic.  The foreskin was retracted and the prepuce was then marked so as to provide a proximately 5 mm collar.  The foreskin was then reduced over the glans penis and marked so as to allow a nice fit for the reanastomosis of the skin to the collar.  A 15 blade was then used to make a circumferential incision through the dermis of both marks. A pair of Metzenbaum scissors was then tunneled through the incisions and the foreskin opened on top of the scissors using electrocautery. The foreskin was then removed circumferentially. The underlying tissue was then cauterized and all bleeding stopped.  The penile skin was then approximated to the collar in 4 quadrants using a 3-0 Vicryl. The frenulum was attached with a U stitch in the dorsal surface. The remaining skin was then closed in a running baseball stitch in 4 separate sutures  using 4-0 Vicryl.  Vaseline impregnated dressing was then applied to the suture line followed by a 4 x 4 and Coban.  The patient was subsequently awoken and returned to PACU in excellent condition. There no immediate complications.

## 2022-12-21 NOTE — Anesthesia Postprocedure Evaluation (Signed)
Anesthesia Post Note  Patient: Patrick Buckley  Procedure(s) Performed: CIRCUMCISION ADULT (Penis)     Patient location during evaluation: PACU Anesthesia Type: General Level of consciousness: awake and alert Pain management: pain level controlled Vital Signs Assessment: post-procedure vital signs reviewed and stable Respiratory status: spontaneous breathing, nonlabored ventilation, respiratory function stable and patient connected to nasal cannula oxygen Cardiovascular status: blood pressure returned to baseline and stable Postop Assessment: no apparent nausea or vomiting Anesthetic complications: no   No notable events documented.  Last Vitals:  Vitals:   12/20/22 1315 12/20/22 1348  BP: 126/86 125/77  Pulse: 99 82  Resp: (!) 25 16  Temp:  37.1 C  SpO2: 98% 98%    Last Pain:  Vitals:   12/20/22 1348  TempSrc:   PainSc: 0-No pain                 Cherisse Carrell S

## 2022-12-22 ENCOUNTER — Encounter (HOSPITAL_BASED_OUTPATIENT_CLINIC_OR_DEPARTMENT_OTHER): Payer: Self-pay | Admitting: Urology
